# Patient Record
Sex: Male | Born: 1980 | Race: White | Hispanic: Yes | Marital: Married | Smoking: Former smoker
Health system: Southern US, Community
[De-identification: ages and names within clinical notes are randomized; demographics above are authoritative.]

## PROBLEM LIST (undated history)

## (undated) DIAGNOSIS — F419 Anxiety disorder, unspecified: Secondary | ICD-10-CM

## (undated) HISTORY — PX: APPENDECTOMY: SHX54

---

## 2016-05-27 ENCOUNTER — Ambulatory Visit: Payer: Self-pay | Admitting: Podiatry

## 2018-01-26 ENCOUNTER — Encounter (HOSPITAL_COMMUNITY): Payer: Self-pay | Admitting: Family Medicine

## 2018-01-26 ENCOUNTER — Ambulatory Visit (HOSPITAL_COMMUNITY)
Admission: EM | Admit: 2018-01-26 | Discharge: 2018-01-26 | Disposition: A | Discharge status: Home / Self Care | Payer: Self-pay | Attending: Internal Medicine | Admitting: Internal Medicine

## 2018-01-26 DIAGNOSIS — J029 Acute pharyngitis, unspecified: Secondary | ICD-10-CM | POA: Insufficient documentation

## 2018-01-26 LAB — POCT RAPID STREP A: Streptococcus, Group A Screen (Direct): NEGATIVE

## 2018-01-26 MED ORDER — CETIRIZINE HCL 10 MG PO CAPS: 10.0000 mg | ORAL_CAPSULE | Freq: Every day | ORAL | 0 refills | Status: DC

## 2018-01-26 NOTE — ED Provider Notes (Signed)
Steve Jimenez    CSN: [id number] Arrival date & time: 01/26/18  1944     History   Chief Complaint Chief Complaint  Patient presents with  . Sore Throat    HPI Steve Jimenez is a 37 y.o. male no significant past medical history presenting today for evaluation of sore throat.  States he has had a sore throat for approximately 1 week, but feels like symptoms have worsened today.  States he had strep earlier this year followed by a sinus infection which she has been on antibiotics for.  States he never fully felt better.  Denies any congestion or rhinorrhea at this time.  Denies cough.  Denies fevers, but states he is taking a lot of ibuprofen daily.  Denies any nausea or vomiting.  Tolerating oral intake like normal.  HPI  History reviewed. No pertinent past medical history.  There are no active problems to display for this patient.   History reviewed. No pertinent surgical history.     Home Medications    Prior to Admission medications   Not on File    Family History History reviewed. No pertinent family history.  Social History Social History   Tobacco Use  . Smoking status: Never Smoker  . Smokeless tobacco: Never Used  Substance Use Topics  . Alcohol use: Not on file  . Drug use: Not on file     Allergies   Patient has no known allergies.   Review of Systems Review of Systems  Constitutional: Negative for activity change, appetite change, fatigue and fever.  HENT: Positive for sore throat. Negative for congestion, ear pain, postnasal drip, rhinorrhea and sinus pressure.   Eyes: Negative for pain and itching.  Respiratory: Negative for cough and shortness of breath.   Cardiovascular: Negative for chest pain.  Gastrointestinal: Negative for abdominal pain, diarrhea, nausea and vomiting.  Musculoskeletal: Negative for myalgias.  Skin: Negative for rash.  Neurological: Negative for dizziness, light-headedness and headaches.      Physical Exam Triage Vital Signs ED Triage Vitals [01/26/18 2109]  Enc Vitals Group     BP      Pulse      Resp      Temp      Temp src      SpO2      Weight      Height      Head Circumference      Peak Flow      Pain Score 4     Pain Loc      Pain Edu?      Excl. in [place]?    No data found.  Updated Vital Signs There were no vitals taken for this visit.  Visual Acuity Right Eye Distance:   Left Eye Distance:   Bilateral Distance:    Right Eye Near:   Left Eye Near:    Bilateral Near:     Physical Exam  Constitutional: He appears well-developed and well-nourished.  HENT:  Head: Normocephalic and atraumatic.  Bilateral TMs nonerythematous, nasal mucosa erythematous, dried rhinorrhea present, posterior oropharynx erythematous, moderate tonsillar enlargement, no exudate.  No uvula swelling or deviation.  Eyes: Conjunctivae are normal.  Neck: Neck supple.  Positive tonsillar lymphadenopathy  Cardiovascular: Normal rate and regular rhythm.  No murmur heard. Pulmonary/Chest: Effort normal and breath sounds normal. No respiratory distress.  Breathing comfortably at rest, CTA BL  Abdominal: Soft. There is no tenderness.  Musculoskeletal: He exhibits no edema.  Neurological: He is alert.  Skin: Skin is warm and dry.  Psychiatric: He has a normal mood and affect.  Nursing note and vitals reviewed.    UC Treatments / Results  Labs (all labs ordered are listed, but only abnormal results are displayed) Labs Reviewed - No data to display  EKG None Radiology No results found.  Procedures Procedures (including critical care time)  Medications Ordered in UC Medications - No data to display   Initial Impression / Assessment and Plan / UC Course  I have reviewed the triage vital signs and the nursing notes.  Pertinent labs & imaging results that were available during my care of the patient were reviewed by me and considered in my medical decision making  (see chart for details).     Strep test negative, sore throat likely viral versus postnasal drainage.  Will begin on daily allergy pill to help with drainage.  Discussed over-the-counter measures to better control his sore throat. Discussed strict return precautions. Patient verbalized understanding and is agreeable with plan.   Final Clinical Impressions(s) / UC Diagnoses   Final diagnoses:  None    ED Discharge Orders    None       Controlled Substance Prescriptions [place] Controlled Substance Registry consulted? Not Applicable   Janith Lima, [place] 01/27/18 [phone]

## 2018-01-26 NOTE — Discharge Instructions (Signed)

## 2018-01-26 NOTE — ED Triage Notes (Signed)
Pt here for sore throat x 1 week. Hx of strep. Pain and swelling more on the right

## 2018-01-28 ENCOUNTER — Telehealth (HOSPITAL_COMMUNITY): Payer: Self-pay

## 2018-01-28 LAB — CULTURE, GROUP A STREP (THRC)

## 2018-01-28 MED ORDER — PENICILLIN V POTASSIUM 500 MG PO TABS: 500.0000 mg | ORAL_TABLET | Freq: Two times a day (BID) | ORAL | 0 refills | Status: AC

## 2018-01-28 NOTE — Telephone Encounter (Signed)
Culture is positive for group A Strep germ.  Prescription for penicillin V 500mg  bid x 10d #20 no refills sent to the pharmacy of record. PT contacted and made aware. Recheck for further evaluation if symptoms are not improving.  Verbalized understanding.

## 2018-02-15 ENCOUNTER — Other Ambulatory Visit: Payer: Self-pay

## 2018-02-15 ENCOUNTER — Encounter (HOSPITAL_COMMUNITY): Payer: Self-pay | Admitting: Emergency Medicine

## 2018-02-15 ENCOUNTER — Ambulatory Visit (HOSPITAL_COMMUNITY)
Admission: EM | Admit: 2018-02-15 | Discharge: 2018-02-15 | Disposition: A | Discharge status: Home / Self Care | Payer: Self-pay | Attending: Family Medicine | Admitting: Family Medicine

## 2018-02-15 DIAGNOSIS — J020 Streptococcal pharyngitis: Secondary | ICD-10-CM

## 2018-02-15 DIAGNOSIS — J029 Acute pharyngitis, unspecified: Secondary | ICD-10-CM

## 2018-02-15 LAB — POCT RAPID STREP A: Streptococcus, Group A Screen (Direct): POSITIVE — AB

## 2018-02-15 MED ORDER — PENICILLIN G BENZATHINE 1200000 UNIT/2ML IM SUSP
1.2000 10*6.[IU] | Freq: Once | INTRAMUSCULAR | Status: AC
  Administered 2018-02-15: 1.2 10*6.[IU] via INTRAMUSCULAR

## 2018-02-15 MED ORDER — PENICILLIN G BENZATHINE 1200000 UNIT/2ML IM SUSP
INTRAMUSCULAR | Status: AC
  Filled 2018-02-15: qty 2

## 2018-02-15 NOTE — ED Triage Notes (Signed)
The patient continued to have symptoms of strep throat after receiving treatment for strep 2 weeks prior. The patient advised that he completed his course of antibiotics and the symptoms returned after completion.

## 2018-02-15 NOTE — ED Provider Notes (Signed)
[place]   [id number] 02/15/18 Arrival Time: 8119  ASSESSMENT & PLAN:  1. Sore throat   2. Strep pharyngitis     Meds ordered this encounter  Medications  . penicillin g benzathine (BICILLIN LA) 1200000 UNIT/2ML injection 1.2 Million Units    Order Specific Question:   Antibiotic Indication:    Answer:   Pharyngitis    Results for orders placed or performed during the hospital encounter of 02/15/18  POCT rapid strep A [facility])  Result Value Ref Range   Streptococcus, Group A Screen (Direct) POSITIVE (A) NEGATIVE   Recommend ENT evaluation if he continues to have recurrent sore throats. Information re: ENT given. OTC analgesics and throat care as needed Will follow up if not showing significant improvement over the next 24-48 hours.  Reviewed expectations re: course of current medical issues. Questions answered. Outlined signs and symptoms indicating need for more acute intervention. Patient verbalized understanding. After Visit Summary given.   SUBJECTIVE:  Steve Jimenez is a 37 y.o. male who reports a sore throat. Has been treated twice in the past month for strep throat; one + rapid test and one + culture. Better after taking antibiotics (amoxicillin and PCN respectively). Symptoms have returned a few days after finishing. Current ST for 2-3 days. Worsening. Painful swallowing last evening. No specific aggravating or alleviating factors reported. No respiratory symptoms. Normal PO intake but reports discomfort with swallowing. Fever reported: no. No associated n/v/abdominal symptoms. Sick contacts: none.  OTC treatment: ibuprofen with some relief.  ROS: As per HPI.   OBJECTIVE:  Vitals:   02/15/18 1011  BP: 136/83  Pulse: 65  Resp: 18  Temp: 98.5 F (36.9 C)  TempSrc: Oral  SpO2: 100%     General appearance: alert; no distress HEENT: throat with tonsillar hypertrophy and mild erythema; uvula midline yes Neck: supple with FROM; no  appreciable LAD Lungs: clear to auscultation bilaterally Skin: reveals no rash; warm and dry Psychological: alert and cooperative; normal mood and affect  No Known Allergies  PMH: As in HPI.  Social History   Socioeconomic History  . Marital status: Married    Spouse name: Not on file  . Number of children: Not on file  . Years of education: Not on file  . Highest education level: Not on file  Occupational History  . Not on file  Social Needs  . Financial resource strain: Not on file  . Food insecurity:    Worry: Not on file    Inability: Not on file  . Transportation needs:    Medical: Not on file    Non-medical: Not on file  Tobacco Use  . Smoking status: Never Smoker  . Smokeless tobacco: Never Used  Substance and Sexual Activity  . Alcohol use: Not on file  . Drug use: Not on file  . Sexual activity: Not on file  Lifestyle  . Physical activity:    Days per week: Not on file    Minutes per session: Not on file  . Stress: Not on file  Relationships  . Social connections:    Talks on phone: Not on file    Gets together: Not on file    Attends religious service: Not on file    Active member of club or organization: Not on file    Attends meetings of clubs or organizations: Not on file    Relationship status: Not on file  . Intimate partner violence:    Fear of current  or ex partner: Not on file    Emotionally abused: Not on file    Physically abused: Not on file    Forced sexual activity: Not on file  Other Topics Concern  . Not on file  Social History Narrative  . Not on file   History reviewed. No pertinent family history.        Vanessa Kick, MD 02/15/18 1106

## 2018-10-27 ENCOUNTER — Encounter (HOSPITAL_COMMUNITY): Payer: Self-pay | Admitting: Emergency Medicine

## 2018-10-27 ENCOUNTER — Ambulatory Visit (HOSPITAL_COMMUNITY)
Admission: EM | Admit: 2018-10-27 | Discharge: 2018-10-27 | Disposition: A | Discharge status: Home / Self Care | Payer: 59 | Attending: Family Medicine | Admitting: Family Medicine

## 2018-10-27 DIAGNOSIS — R21 Rash and other nonspecific skin eruption: Secondary | ICD-10-CM | POA: Insufficient documentation

## 2018-10-27 MED ORDER — METHYLPREDNISOLONE SODIUM SUCC 125 MG IJ SOLR
80.0000 mg | Freq: Once | INTRAMUSCULAR | Status: AC
  Administered 2018-10-27: 80 mg via INTRAMUSCULAR

## 2018-10-27 NOTE — ED Provider Notes (Signed)
[place]    CSN: [id number] Arrival date & time: 10/27/18  1426     History   Chief Complaint Chief Complaint  Patient presents with  . Appointment    330  . Rash    HPI Steve Jimenez is a 38 y.o. male.   HPI  Patient was in [place] last week.  He was walking around shorts around outdoors and states he went kayaking.  He came back and developed a rash on both legs on his left arm.  He feels like it is poison ivy.  He states he always gets serious reactions to poison ivy, and the only way he can cleared up is with a cortisone injection.  He is requesting a cortisone shot today.  History reviewed. No pertinent past medical history.  There are no active problems to display for this patient.   Past Surgical History:  Procedure Laterality Date  . APPENDECTOMY         Home Medications    Prior to Admission medications   Medication Sig Start Date End Date Taking? Authorizing Provider  Cetirizine HCl 10 MG CAPS Take 1 capsule (10 mg total) by mouth daily for 15 days. Patient not taking: Reported on 10/27/2018 01/26/18 02/10/18  Wieters, Hallie C, PA-C  penicillin v potassium (VEETID) 250 MG tablet Take 250 mg by mouth 4 (four) times daily.    [provider]    Family History Family History  Family history unknown: Yes    Social History Social History   Tobacco Use  . Smoking status: Never Smoker  . Smokeless tobacco: Never Used  Substance Use Topics  . Alcohol use: Yes  . Drug use: Not on file     Allergies   Aspirin and Bee venom   Review of Systems Review of Systems  Constitutional: Negative for chills and fever.  HENT: Negative for ear pain and sore throat.   Eyes: Negative for pain and visual disturbance.  Respiratory: Negative for cough and shortness of breath.   Cardiovascular: Negative for chest pain and palpitations.  Gastrointestinal: Negative for abdominal pain and vomiting.  Genitourinary: Negative for dysuria  and hematuria.  Musculoskeletal: Negative for arthralgias and back pain.  Skin: Positive for rash. Negative for color change.  Neurological: Negative for seizures and syncope.  All other systems reviewed and are negative.    Physical Exam Triage Vital Signs ED Triage Vitals  Enc Vitals Group     BP 10/27/18 1524 (!) 149/83     Pulse Rate 10/27/18 1524 70     Resp 10/27/18 1524 18     Temp 10/27/18 1524 98.2 F (36.8 C)     Temp src --      SpO2 10/27/18 1524 99 %     Weight --      Height --      Head Circumference --      Peak Flow --      Pain Score 10/27/18 1525 0     Pain Loc --      Pain Edu? --      Excl. in [place]? --    No data found.  Updated Vital Signs BP (!) 149/83   Pulse 70   Temp 98.2 F (36.8 C)   Resp 18   SpO2 99%   Visual Acuity Right Eye Distance:   Left Eye Distance:   Bilateral Distance:    Right Eye Near:   Left Eye Near:    Bilateral Near:  Physical Exam Constitutional:      General: He is not in acute distress.    Appearance: He is well-developed.  HENT:     Head: Normocephalic and atraumatic.  Eyes:     Conjunctiva/sclera: Conjunctivae normal.     Pupils: Pupils are equal, round, and reactive to light.  Neck:     Musculoskeletal: Normal range of motion.  Cardiovascular:     Rate and Rhythm: Normal rate and regular rhythm.     Heart sounds: Normal heart sounds.  Pulmonary:     Effort: Pulmonary effort is normal. No respiratory distress.     Breath sounds: Normal breath sounds.  Abdominal:     General: There is no distension.     Palpations: Abdomen is soft.  Musculoskeletal: Normal range of motion.  Skin:    General: Skin is warm and dry.     Comments: Both lower extremities are number of small erythematous vesicles, mostly are separate but there is a couple that are linear.  I discussed with him that these could be insect bites versus contact dermatitis/poison ivy.  He states this is typical for his poison ivy.  I told him  without soft tissue swelling we could treat his rash with a cream.  He strongly feels like an injection is easier for him and more effective  Neurological:     Mental Status: He is alert.      UC Treatments / Results  Labs (all labs ordered are listed, but only abnormal results are displayed) Labs Reviewed - No data to display  EKG None  Radiology No results found.  Procedures Procedures (including critical care time)  Medications Ordered in UC Medications  methylPREDNISolone sodium succinate (SOLU-MEDROL) 125 mg/2 mL injection 80 mg (80 mg Intramuscular Given 10/27/18 1543)    Initial Impression / Assessment and Plan / UC Course  I have reviewed the triage vital signs and the nursing notes.  Pertinent labs & imaging results that were available during my care of the patient were reviewed by me and considered in my medical decision making (see chart for details).      Final Clinical Impressions(s) / UC Diagnoses   Final diagnoses:  Rash     Discharge Instructions     Double up on the Zyrtec and take 1 pill twice a day until rash resolves You have received a steroid shot to help fight the inflammation and allergic reaction Return as needed   ED Prescriptions    None     Controlled Substance Prescriptions [place] Controlled Substance Registry consulted? Not Applicable   Raylene Everts, MD 10/27/18 2215

## 2018-10-27 NOTE — Discharge Instructions (Addendum)
Double up on the Zyrtec and take 1 pill twice a day until rash resolves You have received a steroid shot to help fight the inflammation and allergic reaction Return as needed

## 2018-10-27 NOTE — ED Triage Notes (Signed)
Pt states he thinks he has poison ivy on bilateral legs and a little on his arm since Sunday.

## 2018-11-20 ENCOUNTER — Other Ambulatory Visit: Payer: Self-pay

## 2018-11-20 ENCOUNTER — Ambulatory Visit (HOSPITAL_COMMUNITY)
Admission: EM | Admit: 2018-11-20 | Discharge: 2018-11-20 | Disposition: A | Discharge status: Home / Self Care | Payer: 59 | Attending: Internal Medicine | Admitting: Internal Medicine

## 2018-11-20 ENCOUNTER — Encounter (HOSPITAL_COMMUNITY): Payer: Self-pay | Admitting: Emergency Medicine

## 2018-11-20 DIAGNOSIS — L0291 Cutaneous abscess, unspecified: Secondary | ICD-10-CM

## 2018-11-20 MED ORDER — TETRACAINE HCL 0.5 % OP SOLN
OPHTHALMIC | Status: AC
  Filled 2018-11-20: qty 4

## 2018-11-20 MED ORDER — DOXYCYCLINE HYCLATE 100 MG PO CAPS: 100.0000 mg | ORAL_CAPSULE | Freq: Two times a day (BID) | ORAL | 0 refills | Status: AC

## 2018-11-20 MED ORDER — FLUORESCEIN SODIUM 1 MG OP STRP
ORAL_STRIP | OPHTHALMIC | Status: AC
  Filled 2018-11-20: qty 1

## 2018-11-20 NOTE — Discharge Instructions (Signed)
Complete course of antibiotics.  Warm compresses to the area 3-4 times a day, ideally to promote drainage.   Tylenol and/or ibuprofen as needed for pain.  If worsening or no improvement please return as may need incision and drainage of these.

## 2018-11-20 NOTE — ED Triage Notes (Signed)
Patient reports having boils 2 years ago, treated with antibiotics.  Over the week end noticed 2 boils and decided to come to [facility].  One lower left abdomen, one on left side at waist

## 2018-11-21 NOTE — ED Provider Notes (Signed)
[place]    CSN: [id number] Arrival date & time: 11/20/18  1601     History   Chief Complaint Chief Complaint  Patient presents with  . Abscess  . Appointment    4:15 pm    HPI Moriah Loughry Francisco is a 38 y.o. male.   Lenorris presents with concerns of two boils to his trunk. Noted first two days ago and feels they are worsening. Mildly painful. No drainage. No known injury or exposure to the area. States had similar 2 years ago and antibiotics helped with resolution. Denies previous I&D, denies known MRSA history. No known fevers. No nausea or vomiting. Has been applying warm compresses which have minimally helped. Pain 5/10. Without contributing medical history.      ROS per HPI.      History reviewed. No pertinent past medical history.  There are no active problems to display for this patient.   Past Surgical History:  Procedure Laterality Date  . APPENDECTOMY         Home Medications    Prior to Admission medications   Medication Sig Start Date End Date Taking? Authorizing Provider  doxycycline (VIBRAMYCIN) 100 MG capsule Take 1 capsule (100 mg total) by mouth 2 (two) times daily for 7 days. 11/20/18 11/27/18  Zigmund Gottron, NP    Family History Family History  Problem Relation Age of Onset  . Healthy Mother   . Healthy Father     Social History Social History   Tobacco Use  . Smoking status: Never Smoker  . Smokeless tobacco: Never Used  Substance Use Topics  . Alcohol use: Yes  . Drug use: Not on file     Allergies   Aspirin and Bee venom   Review of Systems Review of Systems   Physical Exam Triage Vital Signs ED Triage Vitals  Enc Vitals Group     BP 11/20/18 1624 (!) 141/96     Pulse Rate 11/20/18 1624 62     Resp 11/20/18 1624 18     Temp 11/20/18 1624 98.1 F (36.7 C)     Temp Source 11/20/18 1624 Oral     SpO2 11/20/18 1624 100 %     Weight --      Height --      Head Circumference --      Peak Flow  --      Pain Score 11/20/18 1639 5     Pain Loc --      Pain Edu? --      Excl. in [place]? --    No data found.  Updated Vital Signs BP (!) 141/96 (BP Location: Left Arm)   Pulse 62   Temp 98.1 F (36.7 C) (Oral)   Resp 18   SpO2 100%    Physical Exam Constitutional:      Appearance: He is well-developed.  Cardiovascular:     Rate and Rhythm: Normal rate and regular rhythm.  Pulmonary:     Effort: Pulmonary effort is normal.     Breath sounds: Normal breath sounds.  Skin:    General: Skin is warm and dry.          Comments: Two nickel sized soft red and tender boils, to left low trunk and left low abdomen; minimal fluctuance, no induration; no drainage  Neurological:     Mental Status: He is alert and oriented to person, place, and time.      UC Treatments / Results  Labs (all labs  ordered are listed, but only abnormal results are displayed) Labs Reviewed - No data to display  EKG None  Radiology No results found.  Procedures Procedures (including critical care time)  Medications Ordered in UC Medications - No data to display  Initial Impression / Assessment and Plan / UC Course  I have reviewed the triage vital signs and the nursing notes.  Pertinent labs & imaging results that were available during my care of the patient were reviewed by me and considered in my medical decision making (see chart for details).     Two boil's present with minimal surrounding cellulitis; minimal fluctuance, do not feel these would benefit from incision at this time. Warm compresses, antibiotics. Return precautions provided as may need incision if no improvement with treatment. Patient verbalized understanding and agreeable to plan.    Final Clinical Impressions(s) / UC Diagnoses   Final diagnoses:  Abscess     Discharge Instructions     Complete course of antibiotics.  Warm compresses to the area 3-4 times a day, ideally to promote drainage.   Tylenol and/or  ibuprofen as needed for pain.  If worsening or no improvement please return as may need incision and drainage of these.     ED Prescriptions    Medication Sig Dispense Auth. Provider   doxycycline (VIBRAMYCIN) 100 MG capsule Take 1 capsule (100 mg total) by mouth 2 (two) times daily for 7 days. 14 capsule Zigmund Gottron, NP     Controlled Substance Prescriptions [place] Controlled Substance Registry consulted? Not Applicable   Zigmund Gottron, NP 11/21/18 1134

## 2018-12-21 ENCOUNTER — Encounter: Payer: Self-pay | Admitting: *Deleted

## 2018-12-21 ENCOUNTER — Telehealth: Payer: Self-pay | Admitting: *Deleted

## 2018-12-21 NOTE — Telephone Encounter (Signed)
Referral fax placed in scheduling box [name] PA [place] at [facility] [phone]

## 2019-01-30 ENCOUNTER — Ambulatory Visit: Payer: 59 | Admitting: Cardiovascular Disease

## 2019-02-27 ENCOUNTER — Ambulatory Visit: Payer: 59 | Admitting: Cardiovascular Disease

## 2019-07-10 ENCOUNTER — Ambulatory Visit: Payer: 59 | Admitting: Cardiovascular Disease

## 2019-07-27 ENCOUNTER — Ambulatory Visit: Payer: 59 | Admitting: Cardiovascular Disease

## 2019-07-27 ENCOUNTER — Other Ambulatory Visit: Payer: Self-pay

## 2019-07-27 ENCOUNTER — Encounter: Payer: Self-pay | Admitting: Cardiovascular Disease

## 2019-07-27 DIAGNOSIS — R0789 Other chest pain: Secondary | ICD-10-CM | POA: Diagnosis not present

## 2019-07-27 DIAGNOSIS — R079 Chest pain, unspecified: Secondary | ICD-10-CM | POA: Diagnosis not present

## 2019-07-27 DIAGNOSIS — E782 Mixed hyperlipidemia: Secondary | ICD-10-CM | POA: Diagnosis not present

## 2019-07-27 DIAGNOSIS — E785 Hyperlipidemia, unspecified: Secondary | ICD-10-CM | POA: Insufficient documentation

## 2019-07-27 MED ORDER — COENZYME Q10 200 MG PO CAPS: 200.0000 mg | ORAL_CAPSULE | Freq: Every day | ORAL | 3 refills | Status: DC

## 2019-07-27 MED ORDER — ATORVASTATIN CALCIUM 20 MG PO TABS: 20.0000 mg | ORAL_TABLET | Freq: Every day | ORAL | 3 refills | Status: DC

## 2019-07-27 NOTE — Assessment & Plan Note (Signed)
Mr. Kett  was referred to me by [name], PA-C for evaluation of atypical chest pain.  His only risk factor is mild hyperlipidemia.  He said he developed chest pain approxi-3 to 4 years ago.  Occurs once or twice a year and lasts for a week at a time 24/7 and then resolve spontaneously.  I do not think this is is ischemically mediated.  And to get a coronary calcium score to restratify

## 2019-07-27 NOTE — Patient Instructions (Addendum)
Medication Instructions:  Your physician has recommended you make the following change in your medication:   START ATORVASTATIN (LIPITOR) 20 MG BY MOUTH DAILY  START COENZYME Q 10, 200 MG BY MOUTH DAILY  If you need a refill on your cardiac medications before your next appointment, please call your pharmacy.   Lab work: Your physician recommends that you return for lab work in 2-3 MONTHS:  FASTING LIPID PROFILE AND LIVER FUNCTION TEST.   YOU WILL RECEIVE A LAB SLIP IN THE MAIL. PLEASE DO NOT EAT OR DRINK (EXCEPT WATER) ANYTHING AFTER MIDNIGHT ON THE DAY YOU CHOOSE TO PRESENT FOR LAB WORK. YOU MAY EAT AFTER YOUR BLOOD HAS BEEN COLLECTED. NO APPOINTMENT IS NEEDED.   If you have labs (blood work) drawn today and your tests are completely normal, you will receive your results only by: Marland Kitchen MyChart Message (if you have MyChart) OR . A paper copy in the mail If you have any lab test that is abnormal or we need to change your treatment, we will call you to review the results.  Testing/Procedures: Your physician has recommended that you have a coronary calcium scan. This will cost $150 out-of-pocket.  Coronary Calcium Scan A coronary calcium scan is an imaging test used to look for deposits of calcium and other fatty materials (plaques) in the inner lining of the blood vessels of the heart (coronary arteries). These deposits of calcium and plaques can partly clog and narrow the coronary arteries without producing any symptoms or warning signs. This puts a person at risk for a heart attack. This test can detect these deposits before symptoms develop. Tell a health care provider about:  Any allergies you have.  All medicines you are taking, including vitamins, herbs, eye drops, creams, and over-the-counter medicines.  Any problems you or family members have had with anesthetic medicines.  Any blood disorders you have.  Any surgeries you have had.  Any medical conditions you have.  Whether  you are pregnant or may be pregnant. What are the risks? Generally, this is a safe procedure. However, problems may occur, including:  Harm to a pregnant woman and her unborn baby. This test involves the use of radiation. Radiation exposure can be dangerous to a pregnant woman and her unborn baby. If you are pregnant, you generally should not have this procedure done.  Slight increase in the risk of cancer. This is because of the radiation involved in the test. What happens before the procedure? No preparation is needed for this procedure. What happens during the procedure?   You will undress and remove any jewelry around your neck or chest.  You will put on a hospital gown.  Sticky electrodes will be placed on your chest. The electrodes will be connected to an electrocardiogram (ECG) machine to record a tracing of the electrical activity of your heart.  A CT scanner will take pictures of your heart. During this time, you will be asked to lie still and hold your breath for 2-3 seconds while a picture of your heart is being taken. The procedure may vary among health care providers and hospitals. What happens after the procedure?  You can get dressed.  You can return to your normal activities.  It is up to you to get the results of your test. Ask your health care provider, or the department that is doing the test, when your results will be ready. Summary  A coronary calcium scan is an imaging test used to look for deposits  of calcium and other fatty materials (plaques) in the inner lining of the blood vessels of the heart (coronary arteries).  Generally, this is a safe procedure. Tell your health care provider if you are pregnant or may be pregnant.  No preparation is needed for this procedure.  A CT scanner will take pictures of your heart.  You can return to your normal activities after the scan is done. This information is not intended to replace advice given to you by your  health care provider. Make sure you discuss any questions you have with your health care provider.   Follow-Up: At [facility], you and your health needs are our priority.  As part of our continuing mission to provide you with exceptional heart care, we have created designated Provider Care Teams.  These Care Teams include your primary Cardiologist (physician) and Advanced Practice Providers (APPs -  Physician Assistants and Nurse Practitioners) who all work together to provide you with the care you need, when you need it. You will need a follow up appointment in 6 months with Dr. Quay Burow.  Please call our office 2 months in advance to schedule this appointment.    Any Other Special Instructions Will Be Listed Below (If Applicable).      Heart-Healthy Eating Plan Heart-healthy meal planning includes:  Eating less unhealthy fats.  Eating more healthy fats.  Making other changes in your diet. Talk with your doctor or a diet specialist (dietitian) to create an eating plan that is right for you. What is my plan? Your doctor may recommend an eating plan that includes:  Total fat: ______% or less of total calories a day.  Saturated fat: ______% or less of total calories a day.  Cholesterol: less than _________mg a day. What are tips for following this plan? Cooking Avoid frying your food. Try to bake, boil, grill, or broil it instead. You can also reduce fat by:  Removing the skin from poultry.  Removing all visible fats from meats.  Steaming vegetables in water or broth. Meal planning   At meals, divide your plate into four equal parts: ? Fill one-half of your plate with vegetables and green salads. ? Fill one-fourth of your plate with whole grains. ? Fill one-fourth of your plate with lean protein foods.  Eat 4-5 servings of vegetables per day. A serving of vegetables is: ? 1 cup of raw or cooked vegetables. ? 2 cups of raw leafy greens.  Eat 4-5 servings of  fruit per day. A serving of fruit is: ? 1 medium whole fruit. ?  cup of dried fruit. ?  cup of fresh, frozen, or canned fruit. ?  cup of 100% fruit juice.  Eat more foods that have soluble fiber. These are apples, broccoli, carrots, beans, peas, and barley. Try to get 20-30 g of fiber per day.  Eat 4-5 servings of nuts, legumes, and seeds per week: ? 1 serving of dried beans or legumes equals  cup after being cooked. ? 1 serving of nuts is  cup. ? 1 serving of seeds equals 1 tablespoon. General information  Eat more home-cooked food. Eat less restaurant, buffet, and fast food.  Limit or avoid alcohol.  Limit foods that are high in starch and sugar.  Avoid fried foods.  Lose weight if you are overweight.  Keep track of how much salt (sodium) you eat. This is important if you have high blood pressure. Ask your doctor to tell you more about this.  Try to  add vegetarian meals each week. Fats  Choose healthy fats. These include olive oil and canola oil, flaxseeds, walnuts, almonds, and seeds.  Eat more omega-3 fats. These include salmon, mackerel, sardines, tuna, flaxseed oil, and ground flaxseeds. Try to eat fish at least 2 times each week.  Check food labels. Avoid foods with trans fats or high amounts of saturated fat.  Limit saturated fats. ? These are often found in animal products, such as meats, butter, and cream. ? These are also found in plant foods, such as palm oil, palm kernel oil, and coconut oil.  Avoid foods with partially hydrogenated oils in them. These have trans fats. Examples are stick margarine, some tub margarines, cookies, crackers, and other baked goods. What foods can I eat? Fruits All fresh, canned (in natural juice), or frozen fruits. Vegetables Fresh or frozen vegetables (raw, steamed, roasted, or grilled). Green salads. Grains Most grains. Choose whole wheat and whole grains most of the time. Rice and pasta, including brown rice and pastas  made with whole wheat. Meats and other proteins Lean, well-trimmed beef, veal, pork, and lamb. Chicken and [place] without skin. All fish and shellfish. Wild duck, rabbit, pheasant, and venison. Egg whites or low-cholesterol egg substitutes. Dried beans, peas, lentils, and tofu. Seeds and most nuts. Dairy Low-fat or nonfat cheeses, including ricotta and mozzarella. Skim or 1% milk that is liquid, powdered, or evaporated. Buttermilk that is made with low-fat milk. Nonfat or low-fat yogurt. Fats and oils Non-hydrogenated (trans-free) margarines. Vegetable oils, including soybean, sesame, sunflower, olive, peanut, safflower, corn, canola, and cottonseed. Salad dressings or mayonnaise made with a vegetable oil. Beverages Mineral water. Coffee and tea. Diet carbonated beverages. Sweets and desserts Sherbet, gelatin, and fruit ice. Small amounts of dark chocolate. Limit all sweets and desserts. Seasonings and condiments All seasonings and condiments. The items listed above may not be a complete list of foods and drinks you can eat. Contact a dietitian for more options. What foods should I avoid? Fruits Canned fruit in heavy syrup. Fruit in cream or butter sauce. Fried fruit. Limit coconut. Vegetables Vegetables cooked in cheese, cream, or butter sauce. Fried vegetables. Grains Breads that are made with saturated or trans fats, oils, or whole milk. Croissants. Sweet rolls. Donuts. High-fat crackers, such as cheese crackers. Meats and other proteins Fatty meats, such as hot dogs, ribs, sausage, bacon, rib-eye roast or steak. High-fat deli meats, such as salami and bologna. Caviar. Domestic duck and goose. Organ meats, such as liver. Dairy Cream, sour cream, cream cheese, and creamed cottage cheese. Whole-milk cheeses. Whole or 2% milk that is liquid, evaporated, or condensed. Whole buttermilk. Cream sauce or high-fat cheese sauce. Yogurt that is made from whole milk. Fats and oils Meat fat, or  shortening. Cocoa butter, hydrogenated oils, palm oil, coconut oil, palm kernel oil. Solid fats and shortenings, including bacon fat, salt pork, lard, and butter. Nondairy cream substitutes. Salad dressings with cheese or sour cream. Beverages Regular sodas and juice drinks with added sugar. Sweets and desserts Frosting. Pudding. Cookies. Cakes. Pies. Milk chocolate or white chocolate. Buttered syrups. Full-fat ice cream or ice cream drinks. The items listed above may not be a complete list of foods and drinks to avoid. Contact a dietitian for more information. Summary  Heart-healthy meal planning includes eating less unhealthy fats, eating more healthy fats, and making other changes in your diet.  Eat a balanced diet. This includes fruits and vegetables, low-fat or nonfat dairy, lean protein, nuts and legumes, whole grains, and  heart-healthy oils and fats. This information is not intended to replace advice given to you by your health care provider. Make sure you discuss any questions you have with your health care provider. Document Released: 03/21/2012 Document Revised: 11/24/2017 Document Reviewed: 10/28/2017 Elsevier Patient Education  2020 [employer].

## 2019-07-27 NOTE — Assessment & Plan Note (Signed)
Recent lipid profile performed by his PCP 12/21/2018 revealed a total cholesterol 208, LDL 143 and HDL 33.  Go to begin him on atorvastatin 20 mg a day along with co-Q10 200 mg a day and we will recheck a lipid liver profile in 2 to 3 months.

## 2019-07-27 NOTE — Progress Notes (Signed)
07/27/2019 Steve Jimenez   [birth year]  [id number]  Primary Physician Patient, No Pcp Per Primary Cardiologist: Lorretta Harp MD Steve Jimenez, [place], [place]  HPI:  Steve Jimenez is a 38 y.o. 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  He was referred by [name], PA-C for cardiovascular valuation because of atypical chest pain.  His only risk factor is remote tobacco abuse having smoked a half a pack per day for 10 years and quit 13 years ago.  He has mild hyperlipidemia with lipid profile performed by his PCP 12/21/2018 revealing total cholesterol 208, LDL 143 and HDL of 33 currently not on statin therapy.  There is no family history of heart disease.  Never had heart attack stroke.  He began noticing episodes of chest pain 4 years ago that occurred twice a year lasting a week at a time all day.  Not related to activity nor is it positional and resolve spontaneously.   No outpatient medications have been marked as taking for the 07/27/19 encounter (Office Visit) with Lorretta Harp, MD.     Allergies  Allergen Reactions  . Aspirin   . Bee Venom     Social History   Socioeconomic History  . Marital status: Married    Spouse name: Not on file  . Number of children: Not on file  . Years of education: Not on file  . Highest education level: Not on file  Occupational History  . Not on file  Social Needs  . Financial resource strain: Not on file  . Food insecurity    Worry: Not on file    Inability: Not on file  . Transportation needs    Medical: Not on file    Non-medical: Not on file  Tobacco Use  . Smoking status: Never Smoker  . Smokeless tobacco: Never Used  Substance and Sexual Activity  . Alcohol use: Yes  . Drug use: Not on file  . Sexual activity: Not on file  Lifestyle  . Physical activity    Days per week: Not on file    Minutes per session: Not on file  . Stress:  Not on file  Relationships  . Social Herbalist on phone: Not on file    Gets together: Not on file    Attends religious service: Not on file    Active member of club or organization: Not on file    Attends meetings of clubs or organizations: Not on file    Relationship status: Not on file  . Intimate partner violence    Fear of current or ex partner: Not on file    Emotionally abused: Not on file    Physically abused: Not on file    Forced sexual activity: Not on file  Other Topics Concern  . Not on file  Social History Narrative  . Not on file     Review of Systems: General: negative for chills, fever, night sweats or weight changes.  Cardiovascular: negative for chest pain, dyspnea on exertion, edema, orthopnea, palpitations, paroxysmal nocturnal dyspnea or shortness of breath Dermatological: negative for rash Respiratory: negative for cough or wheezing Urologic: negative for hematuria Abdominal: negative for nausea, vomiting, diarrhea, bright red blood per rectum, melena, or hematemesis Neurologic: negative for visual changes, syncope, or dizziness All other systems reviewed and are otherwise negative except as noted above.    Blood pressure 132/86,  pulse (!) 58, temperature 98 F (36.7 C), height 5\' 7"  (1.702 m), weight 218 lb 9.6 oz (99.2 kg), SpO2 98 %.  General appearance: alert and no distress Neck: no adenopathy, no carotid bruit, no JVD, supple, symmetrical, trachea midline and thyroid not enlarged, symmetric, no tenderness/mass/nodules Lungs: clear to auscultation bilaterally Heart: regular rate and rhythm, S1, S2 normal, no murmur, click, rub or gallop Extremities: extremities normal, atraumatic, no cyanosis or edema Pulses: 2+ and symmetric Skin: Skin color, texture, turgor normal. No rashes or lesions Neurologic: Alert and oriented X 3, normal strength and tone. Normal symmetric reflexes. Normal coordination and gait  EKG sinus rhythm at 63 without  ST or T wave changes.  I personally reviewed this EKG.  ASSESSMENT AND PLAN:   Atypical chest pain [name]  was referred to me by [name], PA-C for evaluation of atypical chest pain.  His only risk factor is mild hyperlipidemia.  He said he developed chest pain approxi-3 to 4 years ago.  Occurs once or twice a year and lasts for a week at a time 24/7 and then resolve spontaneously.  I do not think this is is ischemically mediated.  And to get a coronary calcium score to restratify  Hyperlipidemia Recent lipid profile performed by his PCP 12/21/2018 revealed a total cholesterol 208, LDL 143 and HDL 33.  Go to begin him on atorvastatin 20 mg a day along with co-Q10 200 mg a day and we will recheck a lipid liver profile in 2 to 3 months.      Lorretta Harp MD FACP,FACC,FAHA, [facility] 07/27/2019 3:26 PM

## 2019-08-02 ENCOUNTER — Ambulatory Visit (INDEPENDENT_AMBULATORY_CARE_PROVIDER_SITE_OTHER)
Admission: RE | Admit: 2019-08-02 | Discharge: 2019-08-02 | Disposition: A | Discharge status: Home / Self Care | Payer: Self-pay | Source: Ambulatory Visit | Attending: Cardiovascular Disease | Admitting: Cardiovascular Disease

## 2019-08-02 ENCOUNTER — Other Ambulatory Visit: Payer: Self-pay

## 2019-08-02 DIAGNOSIS — R079 Chest pain, unspecified: Secondary | ICD-10-CM

## 2019-08-02 DIAGNOSIS — R0789 Other chest pain: Secondary | ICD-10-CM

## 2019-09-27 ENCOUNTER — Encounter (HOSPITAL_COMMUNITY): Payer: Self-pay

## 2019-09-27 ENCOUNTER — Other Ambulatory Visit: Payer: Self-pay

## 2019-09-27 ENCOUNTER — Ambulatory Visit (HOSPITAL_COMMUNITY)
Admission: EM | Admit: 2019-09-27 | Discharge: 2019-09-27 | Disposition: A | Discharge status: Home / Self Care | Payer: 59 | Attending: Family Medicine | Admitting: Family Medicine

## 2019-09-27 DIAGNOSIS — R21 Rash and other nonspecific skin eruption: Secondary | ICD-10-CM | POA: Diagnosis not present

## 2019-09-27 DIAGNOSIS — W57XXXA Bitten or stung by nonvenomous insect and other nonvenomous arthropods, initial encounter: Secondary | ICD-10-CM | POA: Diagnosis not present

## 2019-09-27 MED ORDER — PREDNISONE 20 MG PO TABS: ORAL_TABLET | ORAL | 0 refills | Status: DC

## 2019-09-27 NOTE — ED Provider Notes (Signed)
[place]    CSN: [id number] Arrival date & time: 09/27/19  0848      History   Chief Complaint Chief Complaint  Patient presents with  . Rash    HPI Steve Jimenez is a 38 y.o. male.   [facility] patient  Patient presents to Urgent Care with complaints of itchy rash all over from what he believes to be poison oak or ivy since 4 days ago while he was at a lumber yard. Patient reports he rash is all over, but not on his face, has not tried home remedies.      History reviewed. No pertinent past medical history.  Patient Active Problem List   Diagnosis Date Noted  . Atypical chest pain 07/27/2019  . Hyperlipidemia 07/27/2019    Past Surgical History:  Procedure Laterality Date  . APPENDECTOMY         Home Medications    Prior to Admission medications   Medication Sig Start Date End Date Taking? Authorizing Provider  atorvastatin (LIPITOR) 20 MG tablet Take 1 tablet (20 mg total) by mouth daily. 07/27/19 10/25/19  Lorretta Harp, MD  Coenzyme Q10 200 MG capsule Take 1 capsule (200 mg total) by mouth daily. 07/27/19   Lorretta Harp, MD  predniSONE (DELTASONE) 20 MG tablet Two daily with food 09/27/19   Robyn Haber, MD    Family History Family History  Problem Relation Age of Onset  . Hypertension Mother   . Healthy Father     Social History Social History   Tobacco Use  . Smoking status: Never Smoker  . Smokeless tobacco: Never Used  Substance Use Topics  . Alcohol use: Yes    Comment: socially  . Drug use: Not on file     Allergies   Aspirin and Bee venom   Review of Systems Review of Systems   Physical Exam Triage Vital Signs ED Triage Vitals  Enc Vitals Group     BP 09/27/19 0902 137/86     Pulse Rate 09/27/19 0902 67     Resp 09/27/19 0902 16     Temp 09/27/19 0902 98 F (36.7 C)     Temp Source 09/27/19 0902 Oral     SpO2 09/27/19 0902 99 %     Weight --      Height --      Head  Circumference --      Peak Flow --      Pain Score 09/27/19 0901 0     Pain Loc --      Pain Edu? --      Excl. in [place]? --    No data found.  Updated Vital Signs BP 137/86 (BP Location: Left Arm)   Pulse 67   Temp 98 F (36.7 C) (Oral)   Resp 16   SpO2 99%    Physical Exam Vitals and nursing note reviewed.  Constitutional:      Appearance: Normal appearance. He is obese.  Eyes:     Conjunctiva/sclera: Conjunctivae normal.  Pulmonary:     Effort: Pulmonary effort is normal.  Musculoskeletal:        General: Normal range of motion.     Cervical back: Normal range of motion and neck supple.  Skin:    General: Skin is warm and dry.     Findings: Rash present.     Comments: Multiple discrete erythematous papules with central red dot over entire body including dorsal feet, up and down the  arms, and torso  Neurological:     General: No focal deficit present.     Mental Status: He is alert.  Psychiatric:        Mood and Affect: Mood normal.      UC Treatments / Results  Labs (all labs ordered are listed, but only abnormal results are displayed) Labs Reviewed - No data to display  EKG   Radiology No results found.  Procedures Procedures (including critical care time)  Medications Ordered in UC Medications - No data to display  Initial Impression / Assessment and Plan / UC Course  I have reviewed the triage vital signs and the nursing notes.  Pertinent labs & imaging results that were available during my care of the patient were reviewed by me and considered in my medical decision making (see chart for details).    Final Clinical Impressions(s) / UC Diagnoses   Final diagnoses:  Bedbug bite, initial encounter     Discharge Instructions     These discrete bumps are typical for a bed bug infestation, most likely at the hotel in which you stayed in [place]    ED Prescriptions    Medication [place]. Provider   predniSONE (DELTASONE) 20 MG  tablet Two daily with food 10 tablet Robyn Haber, MD     I have reviewed the PDMP during this encounter.   Robyn Haber, MD 09/27/19 [phone]

## 2019-09-27 NOTE — Discharge Instructions (Signed)
These discrete bumps are typical for a bed bug infestation, most likely at the hotel in which you stayed in [place]

## 2019-09-27 NOTE — ED Triage Notes (Signed)
Patient presents to Urgent Care with complaints of itchy rash all over from what he believes to be poison oak or ivy since 4 days ago while he was at a lumber yard. Patient reports he rash is all over, but not on his face, has not tried home remedies.

## 2019-10-05 DIAGNOSIS — I209 Angina pectoris, unspecified: Secondary | ICD-10-CM

## 2019-10-05 HISTORY — DX: Angina pectoris, unspecified: I20.9

## 2020-02-01 ENCOUNTER — Telehealth (INDEPENDENT_AMBULATORY_CARE_PROVIDER_SITE_OTHER): Payer: Self-pay | Admitting: Cardiology

## 2020-02-01 ENCOUNTER — Encounter: Payer: Self-pay | Admitting: Cardiology

## 2020-02-01 VITALS — Ht 67.0 in | Wt 205.0 lb

## 2020-02-01 DIAGNOSIS — E785 Hyperlipidemia, unspecified: Secondary | ICD-10-CM

## 2020-02-01 DIAGNOSIS — R0789 Other chest pain: Secondary | ICD-10-CM

## 2020-02-01 DIAGNOSIS — E782 Mixed hyperlipidemia: Secondary | ICD-10-CM

## 2020-02-01 NOTE — Progress Notes (Signed)
Virtual Visit via Telephone Note   This visit type was conducted due to national recommendations for restrictions regarding the COVID-19 Pandemic (e.g. social distancing) in an effort to limit this patient's exposure and mitigate transmission in our community.  Due to his co-morbid illnesses, this patient is at least at moderate risk for complications without adequate follow up.  This format is felt to be most appropriate for this patient at this time.  The patient did not have access to video technology/had technical difficulties with video requiring transitioning to audio format only (telephone).  All issues noted in this document were discussed and addressed.  No physical exam could be performed with this format.  Please refer to the patient's chart for his  consent to telehealth for [facility].   The patient was identified using 2 identifiers.  Date:  02/01/2020   ID:  Steve Jimenez, DOB June 03, 1981, MRN [id number]  Patient Location: Home Provider Location: Home  PCP:  Patient, No Pcp Per  Cardiologist:  Dr Gwenlyn Found Electrophysiologist:  None   Evaluation Performed:  Follow-Up Visit  Chief Complaint:  none  History of Present Illness:    Steve Jimenez is a 39 y.o. male with a history of chest pain that was felt to be atypical.  He was evaluated by Dr. Alvester Chou in October 2020.  His EKG was normal.  CT calcium scoring showed a score of 0.  He does have dyslipidemia and was placed on Lipitor 20 mg a day.  His PCP will follow this up, he has an appointment in June.  He told me today that he is only taking it every other day because of some side effects he noted.  He is not had recurrent chest pain.  The patient does not have symptoms concerning for COVID-19 infection (fever, chills, cough, or new shortness of breath).    History reviewed. No pertinent past medical history. Past Surgical History:  Procedure Laterality Date  . APPENDECTOMY       Current Meds  Medication  Sig  . atorvastatin (LIPITOR) 20 MG tablet Take 20 mg by mouth daily.  . Coenzyme Q10 200 MG capsule Take 1 capsule (200 mg total) by mouth daily.     Allergies:   Aspirin and Bee venom   Social History   Tobacco Use  . Smoking status: Never Smoker  . Smokeless tobacco: Never Used  Substance Use Topics  . Alcohol use: Yes    Comment: socially  . Drug use: Not on file     Family Hx: The patient's family history includes Healthy in his father; Hypertension in his mother.  ROS:   Please see the history of present illness.    All other systems reviewed and are negative.   Prior CV studies:   The following studies were reviewed today:   Labs/Other Tests and Data Reviewed:    EKG:  An ECG dated 07/27/2019 was personally reviewed today and demonstrated:  [id number]  Recent Labs: No results found for requested labs within last 8760 hours.   Recent Lipid Panel No results found for: CHOL, TRIG, HDL, CHOLHDL, LDLCALC, LDLDIRECT  Wt Readings from Last 3 Encounters:  02/01/20 205 lb (93 kg)  07/27/19 218 lb 9.6 oz (99.2 kg)     Objective:    Vital Signs:  Ht 5\' 7"  (1.702 m)   Wt 205 lb (93 kg)   BMI 32.11 kg/m    VITAL SIGNS:  reviewed  ASSESSMENT & PLAN:    C/P-  Felt to be atypical for angina. Ca++ score 0   HLD- PCP follows- he is taking Lipitor 20 mg QOD  Plan: PRN cardiology f/u  COVID-19 Education: The signs and symptoms of COVID-19 were discussed with the patient and how to seek care for testing (follow up with PCP or arrange E-visit).  The importance of social distancing was discussed today.  Time:   Today, I have spent 10 minutes with the patient with telehealth technology discussing the above problems.     Medication Adjustments/Labs and Tests Ordered: Current medicines are reviewed at length with the patient today.  Concerns regarding medicines are outlined above.   Tests Ordered: No orders of the defined types were placed in this  encounter.   Medication Changes: No orders of the defined types were placed in this encounter.   Follow Up:  Either In Person or Virtual prn  Signed, Kerin Ransom, PA-C  02/01/2020 11:15 AM    [place]

## 2020-02-01 NOTE — Patient Instructions (Signed)
Medication Instructions:  Your physician recommends that you continue on your current medications as directed. Please refer to the Current Medication list given to you today.  *If you need a refill on your cardiac medications before your next appointment, please call your pharmacy*   Follow-Up: At [facility], you and your health needs are our priority.  As part of our continuing mission to provide you with exceptional heart care, we have created designated Provider Care Teams.  These Care Teams include your primary Cardiologist (physician) and Advanced Practice Providers (APPs -  Physician Assistants and Nurse Practitioners) who all work together to provide you with the care you need, when you need it.  We recommend signing up for the patient portal called "MyChart".  Sign up information is provided on this After Visit Summary.  MyChart is used to connect with patients for Virtual Visits (Telemedicine).  Patients are able to view lab/test results, encounter notes, upcoming appointments, etc.  Non-urgent messages can be sent to your provider as well.   To learn more about what you can do with MyChart, go to [email or web address].    Your next appointment:   AS NEEDED with Dr. Berry 

## 2020-03-23 ENCOUNTER — Encounter (HOSPITAL_COMMUNITY): Payer: Self-pay

## 2020-03-23 ENCOUNTER — Other Ambulatory Visit: Payer: Self-pay

## 2020-03-23 ENCOUNTER — Ambulatory Visit (HOSPITAL_COMMUNITY)
Admission: RE | Admit: 2020-03-23 | Discharge: 2020-03-23 | Disposition: A | Discharge status: Home / Self Care | Payer: 59 | Source: Ambulatory Visit | Attending: Family Medicine | Admitting: Family Medicine

## 2020-03-23 VITALS — BP 129/83 | HR 60 | Temp 99.3°F | Resp 16 | Ht 67.0 in | Wt 210.0 lb

## 2020-03-23 DIAGNOSIS — L249 Irritant contact dermatitis, unspecified cause: Secondary | ICD-10-CM

## 2020-03-23 HISTORY — DX: Anxiety disorder, unspecified: F41.9

## 2020-03-23 MED ORDER — TRIAMCINOLONE ACETONIDE 0.1 % EX CREA: 1.0000 "application " | TOPICAL_CREAM | Freq: Two times a day (BID) | CUTANEOUS | 0 refills | Status: DC

## 2020-03-23 MED ORDER — DEXAMETHASONE SODIUM PHOSPHATE 10 MG/ML IJ SOLN
INTRAMUSCULAR | Status: AC
  Filled 2020-03-23: qty 1

## 2020-03-23 MED ORDER — DEXAMETHASONE SODIUM PHOSPHATE 10 MG/ML IJ SOLN
10.0000 mg | Freq: Once | INTRAMUSCULAR | Status: AC
  Administered 2020-03-23: 10 mg via INTRAMUSCULAR

## 2020-03-23 MED ORDER — DEXAMETHASONE SODIUM PHOSPHATE 10 MG/ML IJ SOLN: 10.0000 mg | Freq: Once | INTRAMUSCULAR | Status: DC

## 2020-03-23 NOTE — ED Provider Notes (Signed)
[facility]    CSN: [id number] Arrival date & time: 03/23/20  1341      History   Chief Complaint Chief Complaint  Patient presents with  . Rash    HPI Steve Jimenez is a 39 y.o. male.   HPI  Patient presents with a 2-day history of a rash on the right side of his neck.  Rash has not improved over the course the last 2 days and is now extended to the left side of his neck.  The rash is red and pruritic.  Patient endorses a history of allergy related to poison ivy.   Past Medical History:  Diagnosis Date  . Anxiety     Patient Active Problem List   Diagnosis Date Noted  . Atypical chest pain 07/27/2019  . Hyperlipidemia 07/27/2019    Past Surgical History:  Procedure Laterality Date  . APPENDECTOMY         Home Medications    Prior to Admission medications   Medication Sig Start Date End Date Taking? Authorizing Provider  ALPRAZolam Duanne Moron) 0.5 MG tablet Take 0.5 mg by mouth daily as needed. 12/21/19   [provider]  atorvastatin (LIPITOR) 20 MG tablet Take 20 mg by mouth daily.    [provider]  Coenzyme Q10 200 MG capsule Take 1 capsule (200 mg total) by mouth daily. 07/27/19   Lorretta Harp, MD  ibuprofen (ADVIL) 800 MG tablet Take 800 mg by mouth every 6 (six) hours as needed. 08/18/19   [provider]    Family History Family History  Problem Relation Age of Onset  . Hypertension Mother   . Healthy Father     Social History Social History   Tobacco Use  . Smoking status: Never Smoker  . Smokeless tobacco: Never Used  Vaping Use  . Vaping Use: Never used  Substance Use Topics  . Alcohol use: Yes    Comment: socially  . Drug use: Never     Allergies   Aspirin and Bee venom   Review of Systems Review of Systems Pertinent negatives listed in HPI  Physical Exam Triage Vital Signs ED Triage Vitals [03/23/20 1358]  Enc Vitals Group     BP 129/83     Pulse Rate 60     Resp 16      Temp 99.3 F (37.4 C)     Temp Source Oral     SpO2 98 %     Weight 210 lb (95.3 kg)     Height 5\' 7"  (1.702 m)     Head Circumference      Peak Flow      Pain Score 1     Pain Loc      Pain Edu?      Excl. in [place]?    No data found.  Updated Vital Signs BP 129/83   Pulse 60   Temp 99.3 F (37.4 C) (Oral)   Resp 16   Ht 5\' 7"  (1.702 m)   Wt 210 lb (95.3 kg)   SpO2 98%   BMI 32.89 kg/m   Visual Acuity Right Eye Distance:   Left Eye Distance:   Bilateral Distance:    Right Eye Near:   Left Eye Near:    Bilateral Near:     Physical Exam General appearance: alert, well developed, well nourished Head: Normocephalic, without obvious abnormality, atraumatic Respiratory: Respirations even and unlabored, normal respiratory rate Heart: rate and rhythm normal. No gallop or murmurs  noted on exam  Extremities: No gross deformities Skin: Upper body and BUE macular papular rash present, non edematous Psych: Appropriate mood and affect. Neurologic: GCS 15, gait intact , symmetric movements UC Treatments / Results  Labs (all labs ordered are listed, but only abnormal results are displayed) Labs Reviewed - No data to display  EKG   Radiology No results found.  Procedures Procedures (including critical care time)  Medications Ordered in UC Medications  dexamethasone (DECADRON) injection 10 mg (10 mg Intramuscular Given 03/23/20 1438)    Initial Impression / Assessment and Plan / UC Course  I have reviewed the triage vital signs and the nursing notes.  Pertinent labs & imaging results that were available during my care of the patient were reviewed by me and considered in my medical decision making (see chart for details).     Contact dermatitis, likely source if an irritant thought to be poison ivy. Decadron 10 mg IM administered in clinic. He will continue either benadryl or Pepcid AC to decrease current allergic reaction. Triamcinolone cream TID applied to the  affected area as needed An After Visit Summary was printed and given to the patient/family. Precautions discussed. Red flags discussed. Questions invited and answered. They voiced understanding and agreement.    Final Clinical Impressions(s) / UC Diagnoses   Final diagnoses:  Irritant contact dermatitis, unspecified trigger   Discharge Instructions   None    ED Prescriptions    Medication Sig Dispense Auth. Provider   triamcinolone cream (KENALOG) 0.1 % Apply 1 application topically 2 (two) times daily. 100 g Scot Jun, FNP     PDMP not reviewed this encounter.   Scot Jun, [place] 03/25/20 2154

## 2020-03-23 NOTE — ED Triage Notes (Signed)
Pt c/o rash on neckx3 days. Pt states he was at a facility that produces amines about 4 days. Pt states it thinks he may have gotten it on his clothes and they say if you get that on your skin it can cause a rash. Pt right side of neck is erythematous and has raised red clusters of tiny bumps.

## 2020-03-23 NOTE — Discharge Instructions (Signed)
You received a Decadron injection which is a steroid.  If your symptoms worsen and does not improved with this treatment return for evaluation.  I have also prescribed you triamcinolone cream which is a topical steroid cream which is great for itching and irritation.  Avoid close proximity to the eyes as this could be irritating to the eyes.

## 2020-03-31 ENCOUNTER — Ambulatory Visit (HOSPITAL_COMMUNITY)
Admission: EM | Admit: 2020-03-31 | Discharge: 2020-03-31 | Disposition: A | Discharge status: Home / Self Care | Payer: 59 | Attending: Internal Medicine | Admitting: Internal Medicine

## 2020-03-31 ENCOUNTER — Ambulatory Visit (HOSPITAL_COMMUNITY): Payer: 59

## 2020-03-31 ENCOUNTER — Encounter (HOSPITAL_COMMUNITY): Payer: Self-pay

## 2020-03-31 ENCOUNTER — Other Ambulatory Visit: Payer: Self-pay

## 2020-03-31 DIAGNOSIS — R21 Rash and other nonspecific skin eruption: Secondary | ICD-10-CM

## 2020-03-31 MED ORDER — HYDROXYZINE HCL 25 MG PO TABS: 25.0000 mg | ORAL_TABLET | Freq: Three times a day (TID) | ORAL | 0 refills | Status: DC | PRN

## 2020-03-31 MED ORDER — LIDOCAINE HCL (PF) 1 % IJ SOLN
INTRAMUSCULAR | Status: AC
Start: 2020-03-31 — End: ?
  Filled 2020-03-31: qty 30

## 2020-03-31 NOTE — ED Triage Notes (Signed)
Pt was here last week with complaints of a rash. States he was given steroid shot and a topical steroid. Pt reports 3 days ago he started having burning in the area again. The front of the patients neck is red. He is continuing to use the topical steroid.

## 2020-03-31 NOTE — ED Provider Notes (Signed)
[facility]    CSN: [id number] Arrival date & time: 03/31/20  0848      History   Chief Complaint Chief Complaint  Patient presents with  . Appointment    9  . Rash    HPI Steve Jimenez is a 39 y.o. male comes to the urgent care with complaints of itchy rash over the neck area.  Patient was seen last Thursday for rash over the neck and was prescribed steroid cream after being given dexamethasone injection in the urgent care.  Symptoms appear to have gotten better for couple of days and then the burning sensation with itching started again yesterday.  No rash visible.  No vesicles.  Patient believes that he has been exposed to poison ivy.  No history of herpes infection.   HPI  Past Medical History:  Diagnosis Date  . Anxiety     Patient Active Problem List   Diagnosis Date Noted  . Atypical chest pain 07/27/2019  . Hyperlipidemia 07/27/2019    Past Surgical History:  Procedure Laterality Date  . APPENDECTOMY         Home Medications    Prior to Admission medications   Medication Sig Start Date End Date Taking? Authorizing Provider  ALPRAZolam Duanne Moron) 0.5 MG tablet Take 0.5 mg by mouth daily as needed. 12/21/19   [provider]  atorvastatin (LIPITOR) 20 MG tablet Take 20 mg by mouth daily.    [provider]  Coenzyme Q10 200 MG capsule Take 1 capsule (200 mg total) by mouth daily. 07/27/19   Lorretta Harp, MD  hydrOXYzine (ATARAX/VISTARIL) 25 MG tablet Take 1 tablet (25 mg total) by mouth every 8 (eight) hours as needed for itching. 03/31/20   Lamptey, Myrene Galas, MD  ibuprofen (ADVIL) 800 MG tablet Take 800 mg by mouth every 6 (six) hours as needed. 08/18/19   [provider]    Family History Family History  Problem Relation Age of Onset  . Hypertension Mother   . Healthy Father     Social History Social History   Tobacco Use  . Smoking status: Never Smoker  . Smokeless tobacco: Never Used  Vaping Use   . Vaping Use: Never used  Substance Use Topics  . Alcohol use: Yes    Comment: socially  . Drug use: Never     Allergies   Aspirin and Bee venom   Review of Systems Review of Systems  HENT: Negative.   Gastrointestinal: Negative.  Negative for diarrhea, nausea and vomiting.  Musculoskeletal: Negative.   Skin: Positive for color change and rash. Negative for pallor and wound.  Neurological: Negative.  Negative for dizziness, light-headedness and headaches.     Physical Exam Triage Vital Signs ED Triage Vitals  Enc Vitals Group     BP 03/31/20 0906 133/84     Pulse Rate 03/31/20 0906 (!) 55     Resp 03/31/20 0906 19     Temp 03/31/20 0906 98.3 F (36.8 C)     Temp src --      SpO2 03/31/20 0906 100 %     Weight --      Height --      Head Circumference --      Peak Flow --      Pain Score 03/31/20 0905 1     Pain Loc --      Pain Edu? --      Excl. in [place]? --    No data found.  Updated Vital Signs BP 133/84   Pulse (!) 55   Temp 98.3 F (36.8 C)   Resp 19   SpO2 100%   Visual Acuity Right Eye Distance:   Left Eye Distance:   Bilateral Distance:    Right Eye Near:   Left Eye Near:    Bilateral Near:     Physical Exam Vitals and nursing note reviewed.  Constitutional:      General: He is not in acute distress.    Appearance: He is not ill-appearing.  Cardiovascular:     Rate and Rhythm: Normal rate and regular rhythm.     Pulses: Normal pulses.     Heart sounds: No murmur heard.  No friction rub.  Pulmonary:     Effort: Pulmonary effort is normal.     Breath sounds: No stridor. No wheezing.  Abdominal:     General: Bowel sounds are normal. There is no distension.     Palpations: Abdomen is soft.     Tenderness: There is no guarding or rebound.  Musculoskeletal:        General: Normal range of motion.  Skin:    General: Skin is warm.     Capillary Refill: Capillary refill takes less than 2 seconds.     Findings: Erythema present. No rash.   Neurological:     General: No focal deficit present.     Mental Status: He is alert and oriented to person, place, and time.      UC Treatments / Results  Labs (all labs ordered are listed, but only abnormal results are displayed) Labs Reviewed - No data to display  EKG   Radiology No results found.  Procedures Procedures (including critical care time)  Medications Ordered in UC Medications - No data to display  Initial Impression / Assessment and Plan / UC Course  I have reviewed the triage vital signs and the nursing notes.  Pertinent labs & imaging results that were available during my care of the patient were reviewed by me and considered in my medical decision making (see chart for details).     1.  Erythema over the neck in the sun exposed area: I do not see any rash over the neck.  There is mild erythema and the area involved is consistent with the area exposed to the sun.  This may be a sunburn.  Patient is advised to use sunscreen with the highest SPF.  Discontinue steroid use at this time Hydroxyzine as needed for itching. Return precautions given. Final Clinical Impressions(s) / UC Diagnoses   Final diagnoses:  Rash and nonspecific skin eruption   Discharge Instructions   None    ED Prescriptions    Medication Sig Dispense Auth. Provider   hydrOXYzine (ATARAX/VISTARIL) 25 MG tablet Take 1 tablet (25 mg total) by mouth every 8 (eight) hours as needed for itching. 12 tablet Lamptey, Myrene Galas, MD     PDMP not reviewed this encounter.   Chase Picket, MD 03/31/20 1017

## 2020-04-05 MED ORDER — EPINEPHRINE PF 1 MG/ML IJ SOLN
INTRAMUSCULAR | Status: AC
  Filled 2020-04-05: qty 1

## 2020-04-17 MED ORDER — POVIDONE-IODINE 10 % EX SOLN
CUTANEOUS | Status: AC
  Filled 2020-04-17: qty 118

## 2020-04-17 MED ORDER — ASPIRIN 81 MG PO CHEW
CHEWABLE_TABLET | ORAL | Status: AC
  Filled 2020-04-17: qty 4

## 2020-04-23 MED ORDER — CEFTRIAXONE SODIUM 500 MG IJ SOLR
INTRAMUSCULAR | Status: AC
  Filled 2020-04-23: qty 500

## 2020-04-26 MED ORDER — METHYLPREDNISOLONE SODIUM SUCC 125 MG IJ SOLR
INTRAMUSCULAR | Status: AC
  Filled 2020-04-26: qty 2

## 2020-05-01 MED ORDER — BACITRACIN ZINC 500 UNIT/GM EX OINT
TOPICAL_OINTMENT | CUTANEOUS | Status: AC
  Filled 2020-05-01: qty 0.9

## 2020-05-01 MED ORDER — LIDOCAINE HCL 2 % IJ SOLN
INTRAMUSCULAR | Status: AC
  Filled 2020-05-01: qty 20

## 2020-05-26 ENCOUNTER — Other Ambulatory Visit: Payer: Self-pay

## 2020-05-26 ENCOUNTER — Ambulatory Visit (INDEPENDENT_AMBULATORY_CARE_PROVIDER_SITE_OTHER): Payer: 59 | Admitting: Adult Health

## 2020-05-26 ENCOUNTER — Encounter: Payer: Self-pay | Admitting: Adult Health

## 2020-05-26 VITALS — BP 139/85 | HR 54 | Ht 67.0 in | Wt 210.0 lb

## 2020-05-26 DIAGNOSIS — F411 Generalized anxiety disorder: Secondary | ICD-10-CM | POA: Diagnosis not present

## 2020-05-26 MED ORDER — ALPRAZOLAM 0.5 MG PO TABS: 0.5000 mg | ORAL_TABLET | Freq: Every day | ORAL | 2 refills | Status: DC | PRN

## 2020-05-26 NOTE — Progress Notes (Addendum)
[facility] MD/PA/NP Initial Note  05/26/2020 10:41 AM Steve Jimenez  MRN:  [id number]  Chief Complaint:   HPI:  Describes mood today as  Mood symptoms - denies depression and irritability. Reports anxiety. Stating "I have anxiety a few times a week". Has been taking Xanax 0.5mg  on an as needed basis through PCP. PCP referred him to continue medication. Denies panic attacks. Feels anxiety related to multiple stressors over the past four years. Seeing therapist - Elnora Morrison. In 2017, sister was ran over and he had to keep her children and rebuild her home - she covered. In 2018, business partner shot himself - survived. In 2020, co-worker shot himself. In 2021, business partner filed bankruptcy without his knowledge. Started having a "pain" in his chest in 2019 - PCP sent him for testing and everything was normal. Stable interest and motivation. Taking medications as prescribed.  Energy levels stable. Active, does not have a regular exercise routine. Walks most days after dinner. Enjoys some usuaIl interests and activities. Married. Lives with wife and 4 children. Parents local. Spending time with family. Appetite adequate. Weight stable - 210 pounds. Sleeps well most nights. Averages 6 hours. Napping on the weekends.  Focus and concentration stable. Completing tasks. Managing aspects of household. 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  Denies AH or VH.  Previous medication trials: Xanax  Visit Diagnosis:    ICD-10-CM   1. Generalized anxiety disorder  F41.1 ALPRAZolam (XANAX) 0.5 MG tablet    Past Psychiatric History: Denies psychiatric hospitalization  Past Medical History:  Past Medical History:  Diagnosis Date  . Anxiety     Past Surgical History:  Procedure Laterality Date  . APPENDECTOMY      Family Psychiatric History: Denies any family history of mental illness.  Family History:  Family History  Problem Relation Age of Onset  . Hypertension Mother    . Healthy Father     Social History:  Social History   Socioeconomic History  . Marital status: Married    Spouse name: Not on file  . Number of children: Not on file  . Years of education: Not on file  . Highest education level: Not on file  Occupational History  . Not on file  Tobacco Use  . Smoking status: Never Smoker  . Smokeless tobacco: Never Used  Vaping Use  . Vaping Use: Never used  Substance and Sexual Activity  . Alcohol use: Yes    Comment: socially  . Drug use: Never  . Sexual activity: Not on file  Other Topics Concern  . Not on file  Social History Narrative  . Not on file   Social Determinants of Health   Financial Resource Strain:   . Difficulty of Paying Living Expenses: Not on file  Food Insecurity:   . Worried About Charity fundraiser in the Last Year: Not on file  . Ran Out of Food in the Last Year: Not on file  Transportation Needs:   . Lack of Transportation (Medical): Not on file  . Lack of Transportation (Non-Medical): Not on file  Physical Activity:   . Days of Exercise per Week: Not on file  . Minutes of Exercise per Session: Not on file  Stress:   . Feeling of Stress : Not on file  Social Connections:   . Frequency of Communication with Friends and Family: Not on file  . Frequency of Social Gatherings with Friends and Family: Not on file  . Attends  Religious Services: Not on file  . Active Member of Clubs or Organizations: Not on file  . Attends Archivist Meetings: Not on file  . Marital Status: Not on file    Allergies:  Allergies  Allergen Reactions  . Aspirin   . Bee Venom     Metabolic Disorder Labs: No results found for: HGBA1C, MPG No results found for: PROLACTIN No results found for: CHOL, TRIG, HDL, CHOLHDL, VLDL, LDLCALC No results found for: TSH  Therapeutic Level Labs: No results found for: LITHIUM No results found for: VALPROATE No components found for:  CBMZ  Current Medications: Current  Outpatient Medications  Medication Sig Dispense Refill  . ALPRAZolam (XANAX) 0.5 MG tablet Take 1 tablet (0.5 mg total) by mouth daily as needed. 30 tablet 2  . atorvastatin (LIPITOR) 20 MG tablet Take 20 mg by mouth daily.    . Coenzyme Q10 200 MG capsule Take 1 capsule (200 mg total) by mouth daily. 90 capsule 3  . hydrOXYzine (ATARAX/VISTARIL) 25 MG tablet Take 1 tablet (25 mg total) by mouth every 8 (eight) hours as needed for itching. 12 tablet 0  . ibuprofen (ADVIL) 800 MG tablet Take 800 mg by mouth every 6 (six) hours as needed.     No current facility-administered medications for this visit.    Medication Side Effects: none  Orders placed this visit:  No orders of the defined types were placed in this encounter.   Psychiatric Specialty Exam:  Review of Systems  Blood pressure 139/85, pulse (!) 54, height 5\' 7"  (1.702 m), weight 210 lb (95.3 kg).Body mass index is 32.89 kg/m.  General Appearance: Neat and Well Groomed  Eye Contact:  Good  Speech:  Clear and Coherent and Normal Rate  Volume:  Normal  Mood:  Anxious  Affect:  Appropriate and Congruent  Thought Process:  Coherent and Descriptions of Associations: Intact  Orientation:  Full (Time, Place, and Person)  Thought Content: Logical   Suicidal Thoughts:  No  Homicidal Thoughts:  No  Memory:  WNL  Judgement:  Good  Insight:  Good  Psychomotor Activity:  Normal  Concentration:  Concentration: Good  Recall:  Good  Fund of Knowledge: Good  Language: Good  Assets:  Communication Skills Desire for Improvement Financial Resources/Insurance Housing Intimacy Leisure Time Physical Health Resilience Social Support Talents/Skills Transportation Vocational/Educational  ADL's:  Intact  Cognition: WNL  Prognosis:  Good   Screenings: MDQ  Receiving Psychotherapy: Yes   Treatment Plan/Recommendations:   Plan:  PDMP reviewed  1. Add Xanax 0.5mg  daily PRN anxiety  Read and reviewed note with patient for  accuracy.   RTC 3 months  Patient advised to contact office with any questions, adverse effects, or acute worsening in signs and symptoms.  Discussed potential benefits, risk, and side effects of benzodiazepines to include potential risk of tolerance and dependence, as well as possible drowsiness.  Advised patient not to drive if experiencing drowsiness and to take lowest possible effective dose to minimize risk of dependence and tolerance.  Greater than 50% of face to face time with patient was spent on counseling and coordination of care. We discussed anxiety and panic attacks. Discussed continuing to seeing his therapist.    Aloha Gell, NP

## 2020-07-02 ENCOUNTER — Other Ambulatory Visit: Payer: Self-pay | Admitting: Cardiovascular Disease

## 2020-08-26 ENCOUNTER — Encounter: Payer: Self-pay | Admitting: Adult Health

## 2020-08-26 ENCOUNTER — Ambulatory Visit (INDEPENDENT_AMBULATORY_CARE_PROVIDER_SITE_OTHER): Payer: 59 | Admitting: Adult Health

## 2020-08-26 ENCOUNTER — Other Ambulatory Visit: Payer: Self-pay

## 2020-08-26 DIAGNOSIS — F411 Generalized anxiety disorder: Secondary | ICD-10-CM

## 2020-08-26 MED ORDER — ALPRAZOLAM 0.5 MG PO TABS: 0.5000 mg | ORAL_TABLET | Freq: Every day | ORAL | 2 refills | Status: DC | PRN

## 2020-08-26 NOTE — Progress Notes (Signed)
Steve Jimenez [id number] Oct 28, 1980 39 y.o.  Subjective:   Patient ID:  Steve Jimenez is a 39 y.o. (DOB 1981/05/25) male.  Chief Complaint: No chief complaint on file.   HPI Steve Jimenez presents to the office today for follow-up of anxiety.  Describes mood today as "ok". Pleasant. Mood symptoms - denies depression and irritability. Reports anxiety at times.  Stating "I'm doing alright". Using Xanax a few times a week. Denies panic attacks. Seeing therapist - Steve Jimenez. Stable interest and motivation. Taking medications as prescribed.  Energy levels stable. Active, does not have a regular exercise routine.  Enjoys some usuaIl interests and activities. Married. Lives with wife and 4 children. Parents local. Spending time with family. Appetite adequate. Weight gain - 6 pounds - 216 pounds. Sleeps well most nights. Averages 6 hours.  Focus and concentration stable. Completing tasks. Managing aspects of household. 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  Denies AH or VH.  Previous medication trials: Xanax     Review of Systems:  Review of Systems  Musculoskeletal: Negative for gait problem.  Neurological: Negative for tremors.  Psychiatric/Behavioral:       Please refer to HPI    Medications: I have reviewed the patient's current medications.  Current Outpatient Medications  Medication Sig Dispense Refill  . ALPRAZolam (XANAX) 0.5 MG tablet Take 1 tablet (0.5 mg total) by mouth daily as needed. 30 tablet 2  . atorvastatin (LIPITOR) 20 MG tablet TAKE 1 TABLET BY MOUTH EVERY DAY 90 tablet 3  . Coenzyme Q10 200 MG capsule Take 1 capsule (200 mg total) by mouth daily. 90 capsule 3  . hydrOXYzine (ATARAX/VISTARIL) 25 MG tablet Take 1 tablet (25 mg total) by mouth every 8 (eight) hours as needed for itching. 12 tablet 0  . ibuprofen (ADVIL) 800 MG tablet Take 800 mg by mouth every 6 (six) hours as needed.     No current facility-administered medications  for this visit.    Medication Side Effects: None  Allergies:  Allergies  Allergen Reactions  . Aspirin   . Bee Venom     Past Medical History:  Diagnosis Date  . Anxiety     Family History  Problem Relation Age of Onset  . Hypertension Mother   . Healthy Father     Social History   Socioeconomic History  . Marital status: Married    Spouse name: Not on file  . Number of children: Not on file  . Years of education: Not on file  . Highest education level: Not on file  Occupational History  . Not on file  Tobacco Use  . Smoking status: Never Smoker  . Smokeless tobacco: Never Used  Vaping Use  . Vaping Use: Never used  Substance and Sexual Activity  . Alcohol use: Yes    Comment: socially  . Drug use: Never  . Sexual activity: Not on file  Other Topics Concern  . Not on file  Social History Narrative  . Not on file   Social Determinants of Health   Financial Resource Strain:   . Difficulty of Paying Living Expenses: Not on file  Food Insecurity:   . Worried About Charity fundraiser in the Last Year: Not on file  . Ran Out of Food in the Last Year: Not on file  Transportation Needs:   . Lack of Transportation (Medical): Not on file  . Lack of Transportation (Non-Medical): Not on file  Physical Activity:   .  Days of Exercise per Week: Not on file  . Minutes of Exercise per Session: Not on file  Stress:   . Feeling of Stress : Not on file  Social Connections:   . Frequency of Communication with Friends and Family: Not on file  . Frequency of Social Gatherings with Friends and Family: Not on file  . Attends Religious Services: Not on file  . Active Member of Clubs or Organizations: Not on file  . Attends Archivist Meetings: Not on file  . Marital Status: Not on file  Intimate Partner Violence:   . Fear of Current or Ex-Partner: Not on file  . Emotionally Abused: Not on file  . Physically Abused: Not on file  . Sexually Abused: Not on file     Past Medical History, Surgical history, Social history, and Family history were reviewed and updated as appropriate.   Please see review of systems for further details on the patient's review from today.   Objective:   Physical Exam:  There were no vitals taken for this visit.  Physical Exam Constitutional:      General: He is not in acute distress. Musculoskeletal:        General: No deformity.  Neurological:     Mental Status: He is alert and oriented to person, place, and time.     Coordination: Coordination normal.  Psychiatric:        Attention and Perception: Attention and perception normal. He does not perceive auditory or visual hallucinations.        Mood and Affect: Mood normal. Mood is not anxious or depressed. Affect is not labile, blunt, angry or inappropriate.        Speech: Speech normal.        Behavior: Behavior normal.        Thought Content: Thought content normal. Thought content is not paranoid or delusional. Thought content does not include homicidal or suicidal ideation. Thought content does not include homicidal or suicidal plan.        Cognition and Memory: Cognition and memory normal.        Judgment: Judgment normal.     Comments: Insight intact     Lab Review:  No results found for: NA, K, CL, CO2, GLUCOSE, BUN, CREATININE, CALCIUM, PROT, ALBUMIN, AST, ALT, ALKPHOS, BILITOT, GFRNONAA, GFRAA  No results found for: WBC, RBC, HGB, HCT, PLT, MCV, MCH, MCHC, RDW, LYMPHSABS, MONOABS, EOSABS, BASOSABS  No results found for: POCLITH, LITHIUM   No results found for: PHENYTOIN, PHENOBARB, VALPROATE, CBMZ   .res Assessment: Plan:    Plan:  PDMP reviewed  1. Add Xanax 0.5mg  daily PRN anxiety  Read and reviewed note with patient for accuracy.   RTC 6 months  Patient advised to contact office with any questions, adverse effects, or acute worsening in signs and symptoms.  Discussed potential benefits, risk, and side effects of benzodiazepines to  include potential risk of tolerance and dependence, as well as possible drowsiness.  Advised patient not to drive if experiencing drowsiness and to take lowest possible effective dose to minimize risk of dependence and tolerance.  Greater than 50% of face to face time with patient was spent on counseling and coordination of care. We discussed anxiety and panic attacks. Discussed continuing to seeing his therapist.   Diagnoses and all orders for this visit:  Generalized anxiety disorder -     ALPRAZolam (XANAX) 0.5 MG tablet; Take 1 tablet (0.5 mg total) by mouth daily as needed.  Please see After Visit Summary for patient specific instructions.  No future appointments.  No orders of the defined types were placed in this encounter.   -------------------------------

## 2020-11-28 ENCOUNTER — Ambulatory Visit (INDEPENDENT_AMBULATORY_CARE_PROVIDER_SITE_OTHER): Payer: 59 | Admitting: Adult Health

## 2020-11-28 ENCOUNTER — Other Ambulatory Visit: Payer: Self-pay

## 2020-11-28 ENCOUNTER — Encounter: Payer: Self-pay | Admitting: Adult Health

## 2020-11-28 DIAGNOSIS — M5430 Sciatica, unspecified side: Secondary | ICD-10-CM | POA: Insufficient documentation

## 2020-11-28 DIAGNOSIS — F411 Generalized anxiety disorder: Secondary | ICD-10-CM

## 2020-11-28 DIAGNOSIS — F430 Acute stress reaction: Secondary | ICD-10-CM | POA: Insufficient documentation

## 2020-11-28 DIAGNOSIS — E785 Hyperlipidemia, unspecified: Secondary | ICD-10-CM | POA: Insufficient documentation

## 2020-11-28 MED ORDER — ALPRAZOLAM 0.5 MG PO TABS: 0.5000 mg | ORAL_TABLET | Freq: Every day | ORAL | 2 refills | Status: DC | PRN

## 2020-11-28 NOTE — Progress Notes (Signed)
Antaeus Karel Delbarton [id number] 21-Dec-1980 40 y.o.  Subjective:   Patient ID:  Steve Jimenez is a 40 y.o. (DOB 1981/03/05) male.  Chief Complaint: No chief complaint on file.   HPI Steve Jimenez presents to the office today for follow-up of anxiety.  Describes mood today as "ok". Pleasant. Mood symptoms - denies depression and irritability. Reports anxiety at times.  Denies panic attacks.- "pain in my chest sometimes". Stating "I'm doing fine".Seeing therapist - Elnora Morrison. Stable interest and motivation. Taking medications as prescribed.  Energy levels stable. Active, does not have a regular exercise routine.  Enjoys some usuaIl interests and activities. Married. Lives with wife and 4 children. Parents local. Spending time with family. Appetite adequate. Weight stable - 216 pounds. Sleeps well most nights. Averages 6 hours.  Focus and concentration stable. Completing tasks. Managing aspects of household. 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  Denies AH or VH.  Previous medication trials: Xanax   Review of Systems:  Review of Systems  Musculoskeletal: Negative for gait problem.  Neurological: Negative for tremors.  Psychiatric/Behavioral:       Please refer to HPI    Medications: I have reviewed the patient's current medications.  Current Outpatient Medications  Medication Sig Dispense Refill  . ALPRAZolam (XANAX) 0.5 MG tablet Take 1 tablet (0.5 mg total) by mouth daily as needed. 30 tablet 2  . atorvastatin (LIPITOR) 20 MG tablet TAKE 1 TABLET BY MOUTH EVERY DAY 90 tablet 3  . Coenzyme Q10 200 MG capsule Take 1 capsule (200 mg total) by mouth daily. 90 capsule 3  . hydrOXYzine (ATARAX/VISTARIL) 25 MG tablet Take 1 tablet (25 mg total) by mouth every 8 (eight) hours as needed for itching. 12 tablet 0  . ibuprofen (ADVIL) 800 MG tablet Take 800 mg by mouth every 6 (six) hours as needed.     No current facility-administered medications for this visit.     Medication Side Effects: None  Allergies:  Allergies  Allergen Reactions  . Other Anaphylaxis  . Aspirin     Other reaction(s): as a child  . Bee Venom     Past Medical History:  Diagnosis Date  . Anxiety     Family History  Problem Relation Age of Onset  . Hypertension Mother   . Healthy Father     Social History   Socioeconomic History  . Marital status: Married    Spouse name: Not on file  . Number of children: Not on file  . Years of education: Not on file  . Highest education level: Not on file  Occupational History  . Not on file  Tobacco Use  . Smoking status: Never Smoker  . Smokeless tobacco: Never Used  Vaping Use  . Vaping Use: Never used  Substance and Sexual Activity  . Alcohol use: Yes    Comment: socially  . Drug use: Never  . Sexual activity: Not on file  Other Topics Concern  . Not on file  Social History Narrative  . Not on file   Social Determinants of Health   Financial Resource Strain: Not on file  Food Insecurity: Not on file  Transportation Needs: Not on file  Physical Activity: Not on file  Stress: Not on file  Social Connections: Not on file  Intimate Partner Violence: Not on file    Past Medical History, Surgical history, Social history, and Family history were reviewed and updated as appropriate.   Please see review of systems for  further details on the patient's review from today.   Objective:   Physical Exam:  There were no vitals taken for this visit.  Physical Exam Constitutional:      General: He is not in acute distress. Musculoskeletal:        General: No deformity.  Neurological:     Mental Status: He is alert and oriented to person, place, and time.     Coordination: Coordination normal.  Psychiatric:        Attention and Perception: Attention and perception normal. He does not perceive auditory or visual hallucinations.        Mood and Affect: Mood normal. Mood is not anxious or depressed. Affect  is not labile, blunt, angry or inappropriate.        Speech: Speech normal.        Behavior: Behavior normal.        Thought Content: Thought content normal. Thought content is not paranoid or delusional. Thought content does not include homicidal or suicidal ideation. Thought content does not include homicidal or suicidal plan.        Cognition and Memory: Cognition and memory normal.        Judgment: Judgment normal.     Comments: Insight intact     Lab Review:  No results found for: NA, K, CL, CO2, GLUCOSE, BUN, CREATININE, CALCIUM, PROT, ALBUMIN, AST, ALT, ALKPHOS, BILITOT, GFRNONAA, GFRAA  No results found for: WBC, RBC, HGB, HCT, PLT, MCV, MCH, MCHC, RDW, LYMPHSABS, MONOABS, EOSABS, BASOSABS  No results found for: POCLITH, LITHIUM   No results found for: PHENYTOIN, PHENOBARB, VALPROATE, CBMZ   .res Assessment: Plan:     Plan:  PDMP reviewed  1. Add Xanax 0.5mg  daily PRN anxiety  Read and reviewed note with patient for accuracy.   RTC 6 months - will call in 3 months for refills.  Patient advised to contact office with any questions, adverse effects, or acute worsening in signs and symptoms.  Discussed potential benefits, risk, and side effects of benzodiazepines to include potential risk of tolerance and dependence, as well as possible drowsiness.  Advised patient not to drive if experiencing drowsiness and to take lowest possible effective dose to minimize risk of dependence and tolerance.  Greater than 50% of face to face time with patient was spent on counseling and coordination of care. We discussed anxiety and panic attacks. Discussed continuing to seeing his therapist.     Diagnoses and all orders for this visit:  Generalized anxiety disorder -     ALPRAZolam (XANAX) 0.5 MG tablet; Take 1 tablet (0.5 mg total) by mouth daily as needed.     Please see After Visit Summary for patient specific instructions.  No future appointments.  No orders of the  defined types were placed in this encounter.   -------------------------------

## 2021-01-13 ENCOUNTER — Other Ambulatory Visit: Payer: Self-pay | Admitting: Urology

## 2021-01-13 ENCOUNTER — Other Ambulatory Visit (HOSPITAL_COMMUNITY): Payer: Self-pay | Admitting: Urology

## 2021-01-13 DIAGNOSIS — R310 Gross hematuria: Secondary | ICD-10-CM

## 2021-01-16 ENCOUNTER — Ambulatory Visit (HOSPITAL_COMMUNITY): Payer: Self-pay

## 2021-01-20 ENCOUNTER — Other Ambulatory Visit: Payer: Self-pay

## 2021-01-20 ENCOUNTER — Ambulatory Visit (HOSPITAL_COMMUNITY)
Admission: RE | Admit: 2021-01-20 | Discharge: 2021-01-20 | Disposition: A | Discharge status: Home / Self Care | Payer: Self-pay | Source: Ambulatory Visit | Attending: Urology | Admitting: Urology

## 2021-01-20 DIAGNOSIS — R310 Gross hematuria: Secondary | ICD-10-CM | POA: Insufficient documentation

## 2021-01-20 MED ORDER — SODIUM CHLORIDE 0.9 % IV SOLN
INTRAVENOUS | Status: AC
  Filled 2021-01-20: qty 250

## 2021-01-20 MED ORDER — IOHEXOL 300 MG/ML  SOLN
100.0000 mL | Freq: Once | INTRAMUSCULAR | Status: AC | PRN
  Administered 2021-01-20: 100 mL via INTRAVENOUS

## 2021-02-05 ENCOUNTER — Other Ambulatory Visit: Payer: Self-pay | Admitting: Urology

## 2021-02-17 ENCOUNTER — Other Ambulatory Visit: Payer: Self-pay

## 2021-02-17 ENCOUNTER — Telehealth: Payer: Self-pay | Admitting: Adult Health

## 2021-02-17 DIAGNOSIS — F411 Generalized anxiety disorder: Secondary | ICD-10-CM

## 2021-02-17 MED ORDER — ALPRAZOLAM 0.5 MG PO TABS: 0.5000 mg | ORAL_TABLET | Freq: Every day | ORAL | 2 refills | Status: DC | PRN

## 2021-02-17 NOTE — Telephone Encounter (Signed)
Last filled 01/28/21.pended

## 2021-02-17 NOTE — Telephone Encounter (Signed)
Steve Jimenez called in refill for Alprazolam 0.5mg . Appt 8/19. Pharmacy [facility] [id number] [place]

## 2021-02-17 NOTE — Telephone Encounter (Signed)
Script sent  

## 2021-03-19 DIAGNOSIS — D49512 Neoplasm of unspecified behavior of left kidney: Secondary | ICD-10-CM | POA: Diagnosis not present

## 2021-03-26 NOTE — Patient Instructions (Addendum)
DUE TO COVID-19 ONLY ONE VISITOR IS ALLOWED TO COME WITH YOU AND STAY IN THE WAITING ROOM ONLY DURING PRE OP AND PROCEDURE DAY OF SURGERY. THE 2 VISITORS  MAY VISIT WITH YOU AFTER SURGERY IN YOUR PRIVATE ROOM DURING VISITING HOURS ONLY!  YOU NEED TO HAVE A COVID 19 TEST ON_6/29______ @__10 :30_____, THIS TEST MUST BE DONE BEFORE SURGERY,  COVID TESTING SITE [name] [id number], IT IS ON THE RIGHT GOING OUT [address] APPROXIMATELY  2 MINUTES PAST [employer] ON THE RIGHT. ONCE YOUR COVID TEST IS COMPLETED,  PLEASE BEGIN THE QUARANTINE INSTRUCTIONS AS OUTLINED IN YOUR HANDOUT.                [place]     Your procedure is scheduled on: 04/03/21   Report to [facility] Main  Entrance   Report to admitting at  10:30 AM     Call this number if you have problems the morning of surgery [phone]    Remember: Do not eat food after Midnight.   You may have clear liquids until 7:00 am  BRUSH YOUR TEETH MORNING OF SURGERY AND RINSE YOUR MOUTH OUT, NO CHEWING GUM CANDY OR MINTS.     Take these medicines the morning of surgery with A SIP OF WATER: Xanax if needed                               You may not have any metal on your body including hair              piercings  Do not wear jewelry, lotions, powders or deodorant                         Men may shave face and neck.   Do not bring valuables to the hospital. [place].  Contacts, dentures or bridgework may not be worn into surgery.     _____________________________________________________________________             [facility] - Preparing for Surgery Before surgery, you can play an important role.  Because skin is not sterile, your skin needs to be as free of germs as possible.  You can reduce the number of germs on your skin by washing with CHG (chlorahexidine gluconate) soap before surgery.  CHG is an antiseptic cleaner which  kills germs and bonds with the skin to continue killing germs even after washing. Please DO NOT use if you have an allergy to CHG or antibacterial soaps.  If your skin becomes reddened/irritated stop using the CHG and inform your nurse when you arrive at Short Stay.   You may shave your face/neck.  Please follow these instructions carefully:  1.  Shower with CHG Soap the night before surgery and the  morning of Surgery.  2.  If you choose to wash your hair, wash your hair first as usual with your  normal  shampoo.  3.  After you shampoo, rinse your hair and body thoroughly to remove the  shampoo.                                        4.  Use CHG as you would any other liquid soap.  You can apply chg directly  to the skin and wash                       Gently with a scrungie or clean washcloth.  5.  Apply the CHG Soap to your body ONLY FROM THE NECK DOWN.   Do not use on face/ open                           Wound or open sores. Avoid contact with eyes, ears mouth and genitals (private parts).                       Wash face,  Genitals (private parts) with your normal soap.             6.  Wash thoroughly, paying special attention to the area where your surgery  will be performed.  7.  Thoroughly rinse your body with warm water from the neck down.  8.  DO NOT shower/wash with your normal soap after using and rinsing off  the CHG Soap.             9.  Pat yourself dry with a clean towel.            10.  Wear clean pajamas.            11.  Place clean sheets on your bed the night of your first shower and do not  sleep with pets. Day of Surgery : Do not apply any lotions/deodorants the morning of surgery.  Please wear clean clothes to the hospital/surgery center.  FAILURE TO FOLLOW THESE INSTRUCTIONS MAY RESULT IN THE CANCELLATION OF YOUR SURGERY PATIENT SIGNATURE_________________________________  NURSE  SIGNATURE__________________________________  ________________________________________________________________________   Adam Phenix  An incentive spirometer is a tool that can help keep your lungs clear and active. This tool measures how well you are filling your lungs with each breath. Taking long deep breaths may help reverse or decrease the chance of developing breathing (pulmonary) problems (especially infection) following: A long period of time when you are unable to move or be active. BEFORE THE PROCEDURE  If the spirometer includes an indicator to show your best effort, your nurse or respiratory therapist will set it to a desired goal. If possible, sit up straight or lean slightly forward. Try not to slouch. Hold the incentive spirometer in an upright position. INSTRUCTIONS FOR USE  Sit on the edge of your bed if possible, or sit up as far as you can in bed or on a chair. Hold the incentive spirometer in an upright position. Breathe out normally. Place the mouthpiece in your mouth and seal your lips tightly around it. Breathe in slowly and as deeply as possible, raising the piston or the ball toward the top of the column. Hold your breath for 3-5 seconds or for as long as possible. Allow the piston or ball to fall to the bottom of the column. Remove the mouthpiece from your mouth and breathe out normally. Rest for a few seconds and repeat Steps 1 through 7 at least 10 times every 1-2 hours when you are awake. Take your time and take a few normal breaths between deep breaths. The spirometer may include an indicator to show your best effort. Use the indicator as a goal to work toward during each repetition. After each set  of 10 deep breaths, practice coughing to be sure your lungs are clear. If you have an incision (the cut made at the time of surgery), support your incision when coughing by placing a pillow or rolled up towels firmly against it. Once you are able to get out of  bed, walk around indoors and cough well. You may stop using the incentive spirometer when instructed by your caregiver.  RISKS AND COMPLICATIONS Take your time so you do not get dizzy or light-headed. If you are in pain, you may need to take or ask for pain medication before doing incentive spirometry. It is harder to take a deep breath if you are having pain. AFTER USE Rest and breathe slowly and easily. It can be helpful to keep track of a log of your progress. Your caregiver can provide you with a simple table to help with this. If you are using the spirometer at home, follow these instructions: [place] IF:  You are having difficultly using the spirometer. You have trouble using the spirometer as often as instructed. Your pain medication is not giving enough relief while using the spirometer. You develop fever of 100.5 F (38.1 C) or higher. SEEK IMMEDIATE MEDICAL CARE IF:  You cough up bloody sputum that had not been present before. You develop fever of 102 F (38.9 C) or greater. You develop worsening pain at or near the incision site. MAKE SURE YOU:  Understand these instructions. Will watch your condition. Will get help right away if you are not doing well or get worse. Document Released: 01/31/2007 Document Revised: 12/13/2011 Document Reviewed: 04/03/2007 Exit[employer] Patient Information 2014 [employer], [place].   ________________________________________________________________________  WHAT IS A BLOOD TRANSFUSION? Blood Transfusion Information  A transfusion is the replacement of blood or some of its parts. Blood is made up of multiple cells which provide different functions. Red blood cells carry oxygen and are used for blood loss replacement. White blood cells fight against infection. Platelets control bleeding. Plasma helps clot blood. Other blood products are available for specialized needs, such as hemophilia or other clotting disorders. BEFORE THE TRANSFUSION   Who gives blood for transfusions?  Healthy volunteers who are fully evaluated to make sure their blood is safe. This is blood bank blood. Transfusion therapy is the safest it has ever been in the practice of medicine. Before blood is taken from a donor, a complete history is taken to make sure that person has no history of diseases nor engages in risky social behavior (examples are intravenous drug use or sexual activity with multiple partners). The donor's travel history is screened to minimize risk of transmitting infections, such as malaria. The donated blood is tested for signs of infectious diseases, such as HIV and hepatitis. The blood is then tested to be sure it is compatible with you in order to minimize the chance of a transfusion reaction. If you or a relative donates blood, this is often done in anticipation of surgery and is not appropriate for emergency situations. It takes many days to process the donated blood. RISKS AND COMPLICATIONS Although transfusion therapy is very safe and saves many lives, the main dangers of transfusion include:  Getting an infectious disease. Developing a transfusion reaction. This is an allergic reaction to something in the blood you were given. Every precaution is taken to prevent this. The decision to have a blood transfusion has been considered carefully by your caregiver before blood is given. Blood is not given unless the benefits outweigh the  risks. AFTER THE TRANSFUSION Right after receiving a blood transfusion, you will usually feel much better and more energetic. This is especially true if your red blood cells have gotten low (anemic). The transfusion raises the level of the red blood cells which carry oxygen, and this usually causes an energy increase. The nurse administering the transfusion will monitor you carefully for complications. HOME CARE INSTRUCTIONS  No special instructions are needed after a transfusion. You may find your energy is  better. Speak with your caregiver about any limitations on activity for underlying diseases you may have. SEEK MEDICAL CARE IF:  Your condition is not improving after your transfusion. You develop redness or irritation at the intravenous (IV) site. SEEK IMMEDIATE MEDICAL CARE IF:  Any of the following symptoms occur over the next 12 hours: Shaking chills. You have a temperature by mouth above 102 F (38.9 C), not controlled by medicine. Chest, back, or muscle pain. People around you feel you are not acting correctly or are confused. Shortness of breath or difficulty breathing. Dizziness and fainting. You get a rash or develop hives. You have a decrease in urine output. Your urine turns a dark color or changes to pink, red, or brown. Any of the following symptoms occur over the next 10 days: You have a temperature by mouth above 102 F (38.9 C), not controlled by medicine. Shortness of breath. Weakness after normal activity. The white part of the eye turns yellow (jaundice). You have a decrease in the amount of urine or are urinating less often. Your urine turns a dark color or changes to pink, red, or brown. Document Released: 09/17/2000 Document Revised: 12/13/2011 Document Reviewed: 05/06/2008 [facility] Patient Information 2014 [place], [place].  _______________________________________________________________________

## 2021-03-27 ENCOUNTER — Encounter (HOSPITAL_COMMUNITY): Payer: Self-pay

## 2021-03-27 ENCOUNTER — Encounter (HOSPITAL_COMMUNITY)
Admission: RE | Admit: 2021-03-27 | Discharge: 2021-03-27 | Disposition: A | Discharge status: Home / Self Care | Payer: BC Managed Care – PPO | Source: Ambulatory Visit | Attending: Urology | Admitting: Urology

## 2021-03-27 ENCOUNTER — Other Ambulatory Visit: Payer: Self-pay

## 2021-03-27 DIAGNOSIS — Z01812 Encounter for preprocedural laboratory examination: Secondary | ICD-10-CM | POA: Diagnosis not present

## 2021-03-27 LAB — CBC
HCT: 44.1 % (ref 39.0–52.0)
Hemoglobin: 14.6 g/dL (ref 13.0–17.0)
MCH: 30.5 pg (ref 26.0–34.0)
MCHC: 33.1 g/dL (ref 30.0–36.0)
MCV: 92.3 fL (ref 80.0–100.0)
Platelets: 355 10*3/uL (ref 150–400)
RBC: 4.78 MIL/uL (ref 4.22–5.81)
RDW: 11.9 % (ref 11.5–15.5)
WBC: 5 10*3/uL (ref 4.0–10.5)
nRBC: 0 % (ref 0.0–0.2)

## 2021-03-27 LAB — BASIC METABOLIC PANEL
Anion gap: 9 (ref 5–15)
BUN: 14 mg/dL (ref 6–20)
CO2: 25 mmol/L (ref 22–32)
Calcium: 9.6 mg/dL (ref 8.9–10.3)
Chloride: 104 mmol/L (ref 98–111)
Creatinine, Ser: 0.83 mg/dL (ref 0.61–1.24)
GFR, Estimated: >60 mL/min (ref >60)
Glucose, Bld: 100 mg/dL — ABNORMAL HIGH (ref 70–99)
Potassium: 4.3 mmol/L (ref 3.5–5.1)
Sodium: 138 mmol/L (ref 135–145)

## 2021-03-27 NOTE — Progress Notes (Signed)
COVID Vaccine Completed:Yes Date COVID Vaccine completed:09/23/20 COVID vaccine manufacturer: [employer]   PCP - [name]. LOV 04/14/20 Cardiologist - none  Chest x-ray - no EKG - no Stress Test - no ECHO - no Cardiac Cath - no Pacemaker/ICD device last checked:NA  Sleep Study - yes-negative CPAP - no  Fasting Blood Sugar - NA Checks Blood Sugar _____ times a day  Blood Thinner Instructions:NA Aspirin Instructions: Last Dose:  Anesthesia review: no  Patient denies shortness of breath, fever, cough and chest pain at PAT appointment Yes. Pt has no SOB with any activities. He had chest pain 07/24/19. Work up was done and it was due to Anxiety. None since then  Patient verbalized understanding of instructions that were given to them at the PAT appointment. Patient was also instructed that they will need to review over the PAT instructions again at home before surgery. yes

## 2021-04-01 ENCOUNTER — Other Ambulatory Visit (HOSPITAL_COMMUNITY)
Admission: RE | Admit: 2021-04-01 | Discharge: 2021-04-01 | Disposition: A | Discharge status: Home / Self Care | Payer: BC Managed Care – PPO | Source: Ambulatory Visit | Attending: Urology | Admitting: Urology

## 2021-04-01 ENCOUNTER — Other Ambulatory Visit (HOSPITAL_COMMUNITY): Payer: Self-pay | Admitting: Urology

## 2021-04-01 ENCOUNTER — Other Ambulatory Visit: Payer: Self-pay

## 2021-04-01 ENCOUNTER — Ambulatory Visit (HOSPITAL_COMMUNITY)
Admission: RE | Admit: 2021-04-01 | Discharge: 2021-04-01 | Disposition: A | Discharge status: Home / Self Care | Payer: BC Managed Care – PPO | Source: Ambulatory Visit | Attending: Urology | Admitting: Urology

## 2021-04-01 DIAGNOSIS — K219 Gastro-esophageal reflux disease without esophagitis: Secondary | ICD-10-CM | POA: Diagnosis not present

## 2021-04-01 DIAGNOSIS — Z6836 Body mass index (BMI) 36.0-36.9, adult: Secondary | ICD-10-CM | POA: Diagnosis not present

## 2021-04-01 DIAGNOSIS — D49512 Neoplasm of unspecified behavior of left kidney: Secondary | ICD-10-CM | POA: Diagnosis not present

## 2021-04-01 DIAGNOSIS — M5430 Sciatica, unspecified side: Secondary | ICD-10-CM | POA: Diagnosis not present

## 2021-04-01 DIAGNOSIS — N2889 Other specified disorders of kidney and ureter: Secondary | ICD-10-CM | POA: Diagnosis not present

## 2021-04-01 DIAGNOSIS — F419 Anxiety disorder, unspecified: Secondary | ICD-10-CM | POA: Diagnosis not present

## 2021-04-01 DIAGNOSIS — E785 Hyperlipidemia, unspecified: Secondary | ICD-10-CM | POA: Diagnosis not present

## 2021-04-01 DIAGNOSIS — K668 Other specified disorders of peritoneum: Secondary | ICD-10-CM | POA: Diagnosis not present

## 2021-04-01 DIAGNOSIS — N280 Ischemia and infarction of kidney: Secondary | ICD-10-CM | POA: Diagnosis not present

## 2021-04-01 DIAGNOSIS — C642 Malignant neoplasm of left kidney, except renal pelvis: Secondary | ICD-10-CM | POA: Diagnosis not present

## 2021-04-01 DIAGNOSIS — F430 Acute stress reaction: Secondary | ICD-10-CM | POA: Diagnosis not present

## 2021-04-01 DIAGNOSIS — Z9889 Other specified postprocedural states: Secondary | ICD-10-CM | POA: Diagnosis not present

## 2021-04-01 DIAGNOSIS — Z20822 Contact with and (suspected) exposure to covid-19: Secondary | ICD-10-CM | POA: Diagnosis not present

## 2021-04-01 DIAGNOSIS — I10 Essential (primary) hypertension: Secondary | ICD-10-CM | POA: Diagnosis not present

## 2021-04-01 DIAGNOSIS — Z905 Acquired absence of kidney: Secondary | ICD-10-CM | POA: Diagnosis not present

## 2021-04-01 DIAGNOSIS — E669 Obesity, unspecified: Secondary | ICD-10-CM | POA: Diagnosis not present

## 2021-04-01 DIAGNOSIS — Z87891 Personal history of nicotine dependence: Secondary | ICD-10-CM | POA: Diagnosis not present

## 2021-04-01 LAB — SARS CORONAVIRUS 2 (TAT 6-24 HRS): SARS Coronavirus 2: NEGATIVE

## 2021-04-03 ENCOUNTER — Inpatient Hospital Stay (HOSPITAL_COMMUNITY): Payer: BC Managed Care – PPO | Admitting: Anesthesiology

## 2021-04-03 ENCOUNTER — Inpatient Hospital Stay (HOSPITAL_COMMUNITY)
Admission: AD | Admit: 2021-04-03 | Discharge: 2021-04-04 | DRG: 658 | Disposition: A | Discharge status: Home / Self Care | Payer: BC Managed Care – PPO | Attending: Urology | Admitting: Urology

## 2021-04-03 ENCOUNTER — Encounter (HOSPITAL_COMMUNITY): Payer: Self-pay | Admitting: Urology

## 2021-04-03 ENCOUNTER — Encounter (HOSPITAL_COMMUNITY)
Admission: AD | Disposition: A | Discharge status: Home / Self Care | Payer: Self-pay | Source: Home / Self Care | Attending: Urology

## 2021-04-03 DIAGNOSIS — E669 Obesity, unspecified: Secondary | ICD-10-CM | POA: Diagnosis present

## 2021-04-03 DIAGNOSIS — F419 Anxiety disorder, unspecified: Secondary | ICD-10-CM | POA: Diagnosis present

## 2021-04-03 DIAGNOSIS — Z6836 Body mass index (BMI) 36.0-36.9, adult: Secondary | ICD-10-CM

## 2021-04-03 DIAGNOSIS — Z87891 Personal history of nicotine dependence: Secondary | ICD-10-CM

## 2021-04-03 DIAGNOSIS — Z20822 Contact with and (suspected) exposure to covid-19: Secondary | ICD-10-CM | POA: Diagnosis present

## 2021-04-03 DIAGNOSIS — C642 Malignant neoplasm of left kidney, except renal pelvis: Principal | ICD-10-CM | POA: Diagnosis present

## 2021-04-03 DIAGNOSIS — N2889 Other specified disorders of kidney and ureter: Secondary | ICD-10-CM | POA: Diagnosis present

## 2021-04-03 DIAGNOSIS — R0789 Other chest pain: Secondary | ICD-10-CM

## 2021-04-03 DIAGNOSIS — R079 Chest pain, unspecified: Secondary | ICD-10-CM

## 2021-04-03 DIAGNOSIS — I10 Essential (primary) hypertension: Secondary | ICD-10-CM | POA: Diagnosis present

## 2021-04-03 DIAGNOSIS — K219 Gastro-esophageal reflux disease without esophagitis: Secondary | ICD-10-CM | POA: Diagnosis present

## 2021-04-03 HISTORY — PX: ROBOT ASSISTED LAPAROSCOPIC NEPHRECTOMY: SHX5140

## 2021-04-03 LAB — BASIC METABOLIC PANEL
Anion gap: 7 (ref 5–15)
BUN: 15 mg/dL (ref 6–20)
CO2: 28 mmol/L (ref 22–32)
Calcium: 9.3 mg/dL (ref 8.9–10.3)
Chloride: 103 mmol/L (ref 98–111)
Creatinine, Ser: 1.01 mg/dL (ref 0.61–1.24)
GFR, Estimated: >60 mL/min (ref >60)
Glucose, Bld: 107 mg/dL — ABNORMAL HIGH (ref 70–99)
Potassium: 4 mmol/L (ref 3.5–5.1)
Sodium: 138 mmol/L (ref 135–145)

## 2021-04-03 LAB — TYPE AND SCREEN
ABO/RH(D): O POS
Antibody Screen: NEGATIVE

## 2021-04-03 LAB — HEMOGLOBIN AND HEMATOCRIT, BLOOD
HCT: 43.1 % (ref 39.0–52.0)
Hemoglobin: 14.3 g/dL (ref 13.0–17.0)

## 2021-04-03 LAB — ABO/RH: ABO/RH(D): O POS

## 2021-04-03 SURGERY — NEPHRECTOMY, RADICAL, ROBOT-ASSISTED, LAPAROSCOPIC, ADULT
Anesthesia: General | Laterality: Left

## 2021-04-03 MED ORDER — HYDROMORPHONE HCL 1 MG/ML IJ SOLN
INTRAMUSCULAR | Status: DC | PRN
  Administered 2021-04-03 (×2): 1 mg via INTRAVENOUS

## 2021-04-03 MED ORDER — KETOROLAC TROMETHAMINE 30 MG/ML IJ SOLN
INTRAMUSCULAR | Status: DC | PRN
  Administered 2021-04-03: 30 mg via INTRAVENOUS

## 2021-04-03 MED ORDER — LACTATED RINGERS IV SOLN: INTRAVENOUS | Status: DC

## 2021-04-03 MED ORDER — MIDAZOLAM HCL 5 MG/5ML IJ SOLN
INTRAMUSCULAR | Status: DC | PRN
  Administered 2021-04-03: 2 mg via INTRAVENOUS

## 2021-04-03 MED ORDER — CEFAZOLIN SODIUM-DEXTROSE 2-4 GM/100ML-% IV SOLN
2.0000 g | Freq: Once | INTRAVENOUS | Status: AC
  Administered 2021-04-03: 2 g via INTRAVENOUS
  Filled 2021-04-03: qty 100

## 2021-04-03 MED ORDER — BUPIVACAINE LIPOSOME 1.3 % IJ SUSP
20.0000 mL | Freq: Once | INTRAMUSCULAR | Status: AC
  Administered 2021-04-03: 20 mL
  Filled 2021-04-03 (×2): qty 20

## 2021-04-03 MED ORDER — KETOROLAC TROMETHAMINE 30 MG/ML IJ SOLN
INTRAMUSCULAR | Status: AC
  Filled 2021-04-03: qty 1

## 2021-04-03 MED ORDER — OXYCODONE HCL 5 MG/5ML PO SOLN: 5.0000 mg | Freq: Once | ORAL | Status: DC | PRN

## 2021-04-03 MED ORDER — ROCURONIUM BROMIDE 10 MG/ML (PF) SYRINGE
PREFILLED_SYRINGE | INTRAVENOUS | Status: DC | PRN
  Administered 2021-04-03: 70 mg via INTRAVENOUS
  Administered 2021-04-03: 20 mg via INTRAVENOUS
  Administered 2021-04-03: 30 mg via INTRAVENOUS

## 2021-04-03 MED ORDER — DEXTROSE-NACL 5-0.45 % IV SOLN: INTRAVENOUS | Status: DC

## 2021-04-03 MED ORDER — DEXAMETHASONE SODIUM PHOSPHATE 10 MG/ML IJ SOLN
INTRAMUSCULAR | Status: DC | PRN
  Administered 2021-04-03: 10 mg via INTRAVENOUS

## 2021-04-03 MED ORDER — HYDROMORPHONE HCL 2 MG/ML IJ SOLN
INTRAMUSCULAR | Status: AC
  Filled 2021-04-03: qty 1

## 2021-04-03 MED ORDER — PROMETHAZINE HCL 25 MG/ML IJ SOLN: 6.2500 mg | INTRAMUSCULAR | Status: DC | PRN

## 2021-04-03 MED ORDER — ROCURONIUM BROMIDE 10 MG/ML (PF) SYRINGE
PREFILLED_SYRINGE | INTRAVENOUS | Status: AC
  Filled 2021-04-03: qty 10

## 2021-04-03 MED ORDER — HYDROMORPHONE HCL 1 MG/ML IJ SOLN
INTRAMUSCULAR | Status: AC
  Filled 2021-04-03: qty 1

## 2021-04-03 MED ORDER — MORPHINE SULFATE (PF) 4 MG/ML IV SOLN
2.0000 mg | INTRAVENOUS | Status: AC | PRN
  Administered 2021-04-04: 2 mg via INTRAVENOUS
  Filled 2021-04-03: qty 1

## 2021-04-03 MED ORDER — ALPRAZOLAM 1 MG PO TABS
1.0000 mg | ORAL_TABLET | Freq: Every day | ORAL | Status: AC | PRN
  Administered 2021-04-03: 1 mg via ORAL
  Filled 2021-04-03: qty 1

## 2021-04-03 MED ORDER — FENTANYL CITRATE (PF) 100 MCG/2ML IJ SOLN
INTRAMUSCULAR | Status: DC | PRN
  Administered 2021-04-03: 50 ug via INTRAVENOUS
  Administered 2021-04-03: 100 ug via INTRAVENOUS
  Administered 2021-04-03 (×2): 50 ug via INTRAVENOUS

## 2021-04-03 MED ORDER — ORAL CARE MOUTH RINSE: 15.0000 mL | Freq: Once | OROMUCOSAL | Status: AC

## 2021-04-03 MED ORDER — HYDROMORPHONE HCL 1 MG/ML IJ SOLN
0.2500 mg | INTRAMUSCULAR | Status: DC | PRN
Start: 2021-04-03 — End: 2021-04-03
  Administered 2021-04-03 (×2): 0.25 mg via INTRAVENOUS
  Administered 2021-04-03: 0.5 mg via INTRAVENOUS

## 2021-04-03 MED ORDER — CHLORHEXIDINE GLUCONATE 0.12 % MT SOLN
15.0000 mL | Freq: Once | OROMUCOSAL | Status: AC
  Administered 2021-04-03: 15 mL via OROMUCOSAL

## 2021-04-03 MED ORDER — ACETAMINOPHEN 10 MG/ML IV SOLN
1000.0000 mg | Freq: Four times a day (QID) | INTRAVENOUS | Status: AC
  Administered 2021-04-03 – 2021-04-04 (×4): 1000 mg via INTRAVENOUS
  Filled 2021-04-03 (×4): qty 100

## 2021-04-03 MED ORDER — CEFAZOLIN SODIUM-DEXTROSE 1-4 GM/50ML-% IV SOLN
1.0000 g | Freq: Three times a day (TID) | INTRAVENOUS | Status: AC
  Administered 2021-04-03 – 2021-04-04 (×2): 1 g via INTRAVENOUS
  Filled 2021-04-03 (×2): qty 50

## 2021-04-03 MED ORDER — LORAZEPAM 2 MG/ML IJ SOLN: 1.0000 mg | Freq: Once | INTRAMUSCULAR | Status: DC | PRN

## 2021-04-03 MED ORDER — MIDAZOLAM HCL 2 MG/2ML IJ SOLN
INTRAMUSCULAR | Status: AC
  Filled 2021-04-03: qty 2

## 2021-04-03 MED ORDER — ONDANSETRON HCL 4 MG/2ML IJ SOLN
INTRAMUSCULAR | Status: AC
  Filled 2021-04-03: qty 2

## 2021-04-03 MED ORDER — KETOROLAC TROMETHAMINE 15 MG/ML IJ SOLN
15.0000 mg | Freq: Four times a day (QID) | INTRAMUSCULAR | Status: DC
  Administered 2021-04-03 – 2021-04-04 (×4): 15 mg via INTRAVENOUS
  Filled 2021-04-03 (×4): qty 1

## 2021-04-03 MED ORDER — PROPOFOL 10 MG/ML IV BOLUS
INTRAVENOUS | Status: AC
  Filled 2021-04-03: qty 20

## 2021-04-03 MED ORDER — ONDANSETRON HCL 4 MG/2ML IJ SOLN
INTRAMUSCULAR | Status: DC | PRN
  Administered 2021-04-03: 4 mg via INTRAVENOUS

## 2021-04-03 MED ORDER — OXYCODONE HCL 5 MG PO TABS: 5.0000 mg | ORAL_TABLET | Freq: Once | ORAL | Status: DC | PRN

## 2021-04-03 MED ORDER — SODIUM CHLORIDE (PF) 0.9 % IJ SOLN
INTRAMUSCULAR | Status: DC | PRN
  Administered 2021-04-03: 20 mL

## 2021-04-03 MED ORDER — KETAMINE HCL 10 MG/ML IJ SOLN
INTRAMUSCULAR | Status: DC | PRN
  Administered 2021-04-03: 30 mg via INTRAVENOUS

## 2021-04-03 MED ORDER — DEXAMETHASONE SODIUM PHOSPHATE 10 MG/ML IJ SOLN
INTRAMUSCULAR | Status: AC
  Filled 2021-04-03: qty 1

## 2021-04-03 MED ORDER — SUGAMMADEX SODIUM 200 MG/2ML IV SOLN
INTRAVENOUS | Status: DC | PRN
  Administered 2021-04-03: 200 mg via INTRAVENOUS

## 2021-04-03 MED ORDER — PROPOFOL 10 MG/ML IV BOLUS
INTRAVENOUS | Status: DC | PRN
  Administered 2021-04-03: 160 mg via INTRAVENOUS

## 2021-04-03 MED ORDER — ONDANSETRON HCL 4 MG/2ML IJ SOLN
4.0000 mg | INTRAMUSCULAR | Status: DC | PRN
  Administered 2021-04-03: 4 mg via INTRAVENOUS
  Filled 2021-04-03: qty 2

## 2021-04-03 MED ORDER — LIDOCAINE 2% (20 MG/ML) 5 ML SYRINGE
INTRAMUSCULAR | Status: AC
  Filled 2021-04-03: qty 10

## 2021-04-03 MED ORDER — SODIUM CHLORIDE (PF) 0.9 % IJ SOLN
INTRAMUSCULAR | Status: AC
  Filled 2021-04-03: qty 20

## 2021-04-03 MED ORDER — FENTANYL CITRATE (PF) 250 MCG/5ML IJ SOLN
INTRAMUSCULAR | Status: AC
  Filled 2021-04-03: qty 5

## 2021-04-03 MED ORDER — TRAMADOL HCL 50 MG PO TABS
50.0000 mg | ORAL_TABLET | Freq: Four times a day (QID) | ORAL | Status: DC | PRN
  Administered 2021-04-03 – 2021-04-04 (×2): 50 mg via ORAL
  Filled 2021-04-03 (×2): qty 1

## 2021-04-03 MED ORDER — LACTATED RINGERS IR SOLN
Status: DC | PRN
  Administered 2021-04-03: 1000 mL

## 2021-04-03 MED ORDER — LIDOCAINE HCL 2 % IJ SOLN
INTRAMUSCULAR | Status: AC
  Filled 2021-04-03: qty 20

## 2021-04-03 MED ORDER — LACTATED RINGERS IV SOLN: INTRAVENOUS | Status: DC | PRN

## 2021-04-03 MED ORDER — ACETAMINOPHEN 500 MG PO TABS
1000.0000 mg | ORAL_TABLET | Freq: Once | ORAL | Status: AC
  Administered 2021-04-03: 1000 mg via ORAL
  Filled 2021-04-03: qty 2

## 2021-04-03 MED ORDER — STERILE WATER FOR IRRIGATION IR SOLN
Status: DC | PRN
  Administered 2021-04-03: 1000 mL

## 2021-04-03 MED ORDER — LIDOCAINE 20MG/ML (2%) 15 ML SYRINGE OPTIME
INTRAMUSCULAR | Status: DC | PRN
  Administered 2021-04-03: 1.5 mg/kg/h via INTRAVENOUS

## 2021-04-03 SURGICAL SUPPLY — 59 items
BAG COUNTER SPONGE SURGICOUNT (BAG) IMPLANT
BAG LAPAROSCOPIC 12 15 PORT 16 (BASKET) ×1 IMPLANT
BAG RETRIEVAL 12/15 (BASKET) ×2
BAG URINE DRAIN 2000ML AR STRL (UROLOGICAL SUPPLIES) IMPLANT
BAG URO CATCHER STRL LF (MISCELLANEOUS) IMPLANT
CHLORAPREP W/TINT 26 (MISCELLANEOUS) ×2 IMPLANT
CLIP VESOLOCK LG 6/CT PURPLE (CLIP) ×2 IMPLANT
CLIP VESOLOCK MED LG 6/CT (CLIP) ×2 IMPLANT
CLIP VESOLOCK XL 6/CT (CLIP) ×2 IMPLANT
COVER SURGICAL LIGHT HANDLE (MISCELLANEOUS) ×2 IMPLANT
COVER TIP SHEARS 8 DVNC (MISCELLANEOUS) ×1 IMPLANT
COVER TIP SHEARS 8MM DA VINCI (MISCELLANEOUS) ×1
CUTTER ECHEON FLEX ENDO 45 340 (ENDOMECHANICALS) ×2 IMPLANT
DERMABOND ADVANCED (GAUZE/BANDAGES/DRESSINGS) ×1
DERMABOND ADVANCED .7 DNX12 (GAUZE/BANDAGES/DRESSINGS) ×1 IMPLANT
DRAPE ARM DVNC X/XI (DISPOSABLE) ×4 IMPLANT
DRAPE COLUMN DVNC XI (DISPOSABLE) ×1 IMPLANT
DRAPE DA VINCI XI ARM (DISPOSABLE) ×4
DRAPE DA VINCI XI COLUMN (DISPOSABLE) ×1
DRAPE FOOT SWITCH (DRAPES) IMPLANT
DRAPE INCISE IOBAN 66X45 STRL (DRAPES) ×2 IMPLANT
DRAPE SHEET LG 3/4 BI-LAMINATE (DRAPES) ×2 IMPLANT
ELECT PENCIL ROCKER SW 15FT (MISCELLANEOUS) ×2 IMPLANT
ELECT REM PT RETURN 15FT ADLT (MISCELLANEOUS) ×2 IMPLANT
GLOVE SURG ENC MOIS LTX SZ6.5 (GLOVE) IMPLANT
GLOVE SURG ENC TEXT LTX SZ7.5 (GLOVE) ×4 IMPLANT
GOWN STRL REUS W/TWL LRG LVL3 (GOWN DISPOSABLE) ×8 IMPLANT
HOLDER FOLEY CATH W/STRAP (MISCELLANEOUS) ×2 IMPLANT
IRRIG SUCT STRYKERFLOW 2 WTIP (MISCELLANEOUS) ×2
IRRIGATION SUCT STRKRFLW 2 WTP (MISCELLANEOUS) ×1 IMPLANT
KIT BASIN OR (CUSTOM PROCEDURE TRAY) ×2 IMPLANT
KIT TURNOVER KIT A (KITS) ×2 IMPLANT
LOOP CUT BIPOLAR 24F LRG (ELECTROSURGICAL) IMPLANT
MANIFOLD NEPTUNE II (INSTRUMENTS) ×2 IMPLANT
MARKER SKIN DUAL TIP RULER LAB (MISCELLANEOUS) IMPLANT
NEEDLE INSUFFLATION 14GA 120MM (NEEDLE) ×2 IMPLANT
PACK CYSTO (CUSTOM PROCEDURE TRAY) IMPLANT
PENCIL SMOKE EVACUATOR (MISCELLANEOUS) IMPLANT
PROTECTOR NERVE ULNAR (MISCELLANEOUS) ×2 IMPLANT
SEAL CANN UNIV 5-8 DVNC XI (MISCELLANEOUS) ×4 IMPLANT
SEAL XI 5MM-8MM UNIVERSAL (MISCELLANEOUS) ×4
SET TUBE SMOKE EVAC HIGH FLOW (TUBING) ×2 IMPLANT
SOLUTION ELECTROLUBE (MISCELLANEOUS) ×2 IMPLANT
STAPLE RELOAD 45 WHT (STAPLE) ×3 IMPLANT
STAPLE RELOAD 45MM WHITE (STAPLE) ×3
SUT MNCRL AB 4-0 PS2 18 (SUTURE) ×4 IMPLANT
SUT PDS AB 0 CT1 36 (SUTURE) ×4 IMPLANT
SUT VICRYL 0 UR6 27IN ABS (SUTURE) ×4 IMPLANT
SYR TOOMEY IRRIG 70ML (MISCELLANEOUS)
SYRINGE TOOMEY IRRIG 70ML (MISCELLANEOUS) IMPLANT
TOWEL OR 17X26 10 PK STRL BLUE (TOWEL DISPOSABLE) ×2 IMPLANT
TOWEL OR NON WOVEN STRL DISP B (DISPOSABLE) ×2 IMPLANT
TRAY FOLEY MTR SLVR 16FR STAT (SET/KITS/TRAYS/PACK) ×2 IMPLANT
TRAY LAPAROSCOPIC (CUSTOM PROCEDURE TRAY) ×2 IMPLANT
TROCAR BLADELESS OPT 5 100 (ENDOMECHANICALS) IMPLANT
TROCAR XCEL 12X100 BLDLESS (ENDOMECHANICALS) ×2 IMPLANT
TUBING CONNECTING 10 (TUBING) IMPLANT
TUBING UROLOGY SET (TUBING) IMPLANT
WATER STERILE IRR 1000ML POUR (IV SOLUTION) ×2 IMPLANT

## 2021-04-03 NOTE — Op Note (Signed)
Operative Note  Preoperative diagnosis:  1.  Left renal mass  Postoperative diagnosis: 1.  Left renal mass   Procedure(s): 1.  Robot-assisted laparoscopic left radical nephrectomy  Surgeon: Ellison Hughs, MD  Assistants:  Bishop Limbo, MD PGY4  Anesthesia:  General  Complications:  None  EBL: 50 mL  Specimens: 1.  Left kidney  Drains/Catheters: 1.  Foley catheter  Intraoperative findings:   Hemostatic left renal hilum following staple ligation  Indication:  Steve Jimenez is a 40 y.o. male with an 11.5 cm solid and enhancing left renal mass with features concerning for renal cell carcinoma.  He has been consented for the above procedures, voices understanding and wishes to proceed.  Description of procedure:  After informed consent was obtained, the patient was brought to the operating room and general endotracheal anesthesia was administered.  The patient was then placed in the right lateral decubitus position and prepped and draped in usual sterile fashion.  A timeout was performed.  An 8 mm incision was then made lateral to the left rectus muscle at the level of the left 12th rib.  Abdominal access was obtained via a Veress needle.  The abdominal cavity was then insufflated up to 15 mmHg.  An 8 mm port was then introduced into the abdominal cavity.  Inspection of the port entry site by the robotic camera revealed no adjacent organ injury.  We then placed 3 additional 8 mm robotic ports to triangulate the left renal hilum.  A 12 mm assistant port was then placed between the carmera port and 3rd robotic arm.  The white line of Toldt along the descending colon was incised sharply and the colon, along with its mesocolonic fat, was reflected medially until the aorta was identified.  We then made a small window adjacent to the lower pole of the left kidney, identifying the left psoas muscle, left ureter and left gonadal vein.  The left ureter and gonadal vein were then  reflected anteriorly allowing [place] to then incised the perihilar attachments using electrocautery.  We encountered a small lumbar vein adjacent to the insertion of the left gonadal vein into the left renal vein.  This lumbar vein was ligated with hemo-lock clips in 2 places and incised sharply.  This provided [place] excellent exposure to the left renal hilum.    A 45 mm endovascular stapler was then used to ligate the left renal artery and then the left renal vein, achieving excellent hemostasis.  The remaining peri-renal attachments were then excised using a combination of blunt dissection and electrocautery.  The left adrenal gland was spared.  The endovascular stapler was then used to ligate the left gonadal vein and left ureter.  Once the kidney was freely mobile, it was placed in Endo Catch bag to be be retrieved at the conclusion of the case.  The robot was then de-docked.  A left lower quadrant Gibson incision was then made and the mass was removed within the Endo Catch bag.  The fascia within the midline assistant port was then closed using an interrupted 0 Vicryl suture.  The fascia of the internal and external oblique was then closed using a 0 PDS suture in a running fashion.  The subcutaneous tissue within the [name] incision was then closed using a running 0 Vicryl suture.  All skin incisions were then closed using 4-0 Monocryl and then dressed with Dermabond.  The patient tolerated the procedure well and was transferred to the postanesthesia in stable condition.  Plan: Monitor on the floor overnight

## 2021-04-03 NOTE — Progress Notes (Signed)
[facility]  Code Status: FULL Steve Jimenez is a 40 y.o. male patient admitted from PACU awake, alert - oriented X4 - no acute distress noted. VSS - Call light within reach, patient able to voice, and demonstrate understanding. 5 ports to abdomen clean and dry. No evidence of skin break down noted on exam.  ?  Will cont to eval and treat per MD orders.  Melonie Florida, RN  04/03/2021

## 2021-04-03 NOTE — Interval H&P Note (Signed)
History and Physical Interval Note:  04/03/2021 12:05 PM  Steve Jimenez  has presented today for surgery, with the diagnosis of LEFT RENAL MASS.  The various methods of treatment have been discussed with the patient and family. After consideration of risks, benefits and other options for treatment, the patient has consented to  Procedure(s): XI ROBOTIC ASSISTED LAPAROSCOPIC NEPHRECTOMY VERSUS NEPHROURETERECTOMY (Left) CYSTOSCOPY WITH URETEROSCOPY/ POSSIBLE RENAL MASS BIOPSY/ POSSIBLE URANSURETHRAL RESECTION OF LEFT URETERAL ORIFICE (Left) as a surgical intervention.  The patient's history has been reviewed, patient examined, no change in status, stable for surgery.  I have reviewed the patient's chart and labs.  Questions were answered to the patient's satisfaction.     Chigozie Basaldua Abu-Salha

## 2021-04-03 NOTE — Transfer of Care (Signed)
Immediate Anesthesia Transfer of Care Note  Patient: Steve Jimenez  Procedure(s) Performed: XI ROBOTIC ASSISTED LAPAROSCOPIC NEPHRECTOMY (Left)  Patient Location: PACU  Anesthesia Type:General  Level of Consciousness: awake, alert  and oriented  Airway & Oxygen Therapy: Patient Spontanous Breathing and Patient connected to face mask oxygen  Post-op Assessment: Report given to RN and Post -op Vital signs reviewed and stable  Post vital signs: Reviewed and stable  Last Vitals:  Vitals Value Taken Time  BP 145/96 04/03/21 1536  Temp    Pulse 60 04/03/21 1539  Resp 19 04/03/21 1539  SpO2 100 % 04/03/21 1539  Vitals shown include unvalidated device data.  Last Pain:  Vitals:   04/03/21 1020  TempSrc: Oral         Complications: No notable events documented.

## 2021-04-03 NOTE — Anesthesia Procedure Notes (Signed)
Procedure Name: Intubation Date/Time: 04/03/2021 12:30 PM Performed by: Trayson Stitely D, CRNA Pre-anesthesia Checklist: Patient identified, Emergency Drugs available, Suction available and Patient being monitored Patient Re-evaluated:Patient Re-evaluated prior to induction Oxygen Delivery Method: Circle system utilized Preoxygenation: Pre-oxygenation with 100% oxygen Induction Type: IV induction Ventilation: Mask ventilation without difficulty Laryngoscope Size: Mac and 4 Grade View: Grade II Tube type: Oral Tube size: 7.5 mm Number of attempts: 1 Airway Equipment and Method: Stylet Placement Confirmation: ETT inserted through vocal cords under direct vision, positive ETCO2 and breath sounds checked- equal and bilateral Secured at: 22 cm Tube secured with: Tape Dental Injury: Teeth and Oropharynx as per pre-operative assessment

## 2021-04-03 NOTE — H&P (Signed)
PRE-OP H&P  Office Visit Report     03/19/2021   --------------------------------------------------------------------------------   Steve Jimenez  MRN: [id number]  DOB: 1981-04-27, 40 year old male  PRIMARY CARE:    REFERRING:    PROVIDER:  Ellison Hughs, M.D.  LOCATION:  [facility] [phone]     --------------------------------------------------------------------------------   CC/HPI: CC: Left renal mass   The patient is a 40 year old male with a solid and enhancing 11.5 cm left renal mass with indeterminate features between renal cell carcinoma and urothelial cell carcinoma.   03/19/2021: Patient with the above-noted history. He presents today for a preoperative appointment and to discuss his surgery in more detail. He recently returned from a work trip in [place]. He denies interval flank pain, gross hematuria, neurologic changes or weight loss.       ALLERGIES: Aspirin TABS Bee stings    MEDICATIONS: Alprazolam 0.5 mg tablet     GU PSH: Vasectomy     NON-GU PSH: Appendectomy - 2013     GU PMH: Disorder of male genital organs, unspecified - 2018 Encounter for sterilization, Encounter for sterilization - 2014      PMH Notes:  1898-10-04 00:00:00 - Note: Normal Routine History And Physical Adult   NON-GU PMH: Personal history of other specified conditions, History of heartburn - 2014 Anxiety GERD Hypertension    FAMILY HISTORY: 2 daughters - Daughter 2 sons - Son Family Health Status - Father alive at age 50 - 37 In Family Family Health Status - Mother's Age - Runs In Family Family Health Status Number - Runs In Family   SOCIAL HISTORY: Marital Status: Married Preferred Language: English Current Smoking Status: Patient does not smoke anymore. Has not smoked since 01/03/2007. Smoked for 6 years.   Tobacco Use Assessment Completed: Used Tobacco in last 30 days? Does drink.  Drinks 2 caffeinated drinks per day. 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     Notes: Tobacco use, Occupation:, Marital History - Currently Married, Caffeine Use, Alcohol Use   VITAL SIGNS:      03/19/2021 03:50 PM  Weight 220 lb / 99.79 kg  Height 6 in / 15.24 cm  BP 131/77 mmHg  Pulse 55 /min  Temperature 97.5 F / 36.3 C  BMI 4,296.1 kg/m   MULTI-SYSTEM PHYSICAL EXAMINATION:    Constitutional: Well-nourished. No physical deformities. Normally developed. Good grooming.  Respiratory: No labored breathing, no use of accessory muscles.   Cardiovascular: Normal temperature, normal extremity pulses, no swelling, no varicosities.  Neurologic / Psychiatric: Oriented to time, oriented to place, oriented to person. No depression, no anxiety, no agitation.  Gastrointestinal: Obese abdomen. No mass, no tenderness, no rigidity.   Musculoskeletal: Normal gait and station of head and neck.     Complexity of Data:  Lab Test Review:   BUN/Creatinine  Records Review:   Previous Patient Records  X-Ray Review: C.T. Abdomen/Pelvis: Reviewed Films. Reviewed Report. Discussed With Patient.     PROCEDURES:          Urinalysis w/Scope Dipstick Dipstick Cont'd Micro  Color: Yellow Bilirubin: Neg mg/dL WBC/hpf: 6 - 10/hpf  Appearance: Clear Ketones: Neg mg/dL RBC/hpf: 0 - 2/hpf  Specific Gravity: 1.020 Blood: Neg ery/uL Bacteria: Rare (0-9/hpf)  pH: <=5.0 Protein: Neg mg/dL Cystals: NS (Not Seen)  Glucose: Neg mg/dL Urobilinogen: 0.2 mg/dL Casts: NS (Not Seen)    Nitrites: Neg Trichomonas: Not Present    Leukocyte Esterase: Trace leu/uL Mucous: Not Present      Epithelial Cells:  NS (Not Seen)      Yeast: NS (Not Seen)      Sperm: Not Present    ASSESSMENT:      ICD-10 Details  1 GU:   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 Contrast          Schedule Return Visit/Planned Activity: ASAP - Schedule Surgery             Note: Ok to cancel OV 03/27/21           Document Letter(s):  Created for Patient: Clinical Summary         Notes:   -I reviewed imaging results and films with the patient personally. We discussed that the mass in question has features concerning for malignancy. I explained the natural history of presumed renal cell carcinoma vs urothelial cell carcinoma. I reviewed the AUA guidelines for evaluation and treatment of renal masses. radical nephrectomy with and without nephroureterectomy was discussed. The risks of laparoscopic LEFT radical nephrectomy vs nephroureterectomy were discussed in detail including but not limited to: negative pathology, open conversion, infection of the skin/abdominal cavity, VTE, MI/CVA, lymphatic leak, injury to adjacent solid/hollow viscus organs, bleeding requiring a blood transfusion, catastrophic bleeding, hernia formation and other imponderables. The patient voices understanding and wishes to proceed.

## 2021-04-03 NOTE — Anesthesia Preprocedure Evaluation (Addendum)
Anesthesia Evaluation  Patient identified by MRN, date of birth, ID band Patient awake    Reviewed: Allergy & Precautions, NPO status , Patient's Chart, lab work & pertinent test results  Airway Mallampati: III  TM Distance: >3 FB Neck ROM: Full    Dental no notable dental hx. (+)    Pulmonary neg pulmonary ROS, former smoker,    Pulmonary exam normal breath sounds clear to auscultation       Cardiovascular negative cardio ROS Normal cardiovascular exam Rhythm:Regular Rate:Normal  EKG: NSR   Neuro/Psych PSYCHIATRIC DISORDERS Anxiety  Neuromuscular disease (sciatica) negative neurological ROS  negative psych ROS   GI/Hepatic negative GI ROS, Neg liver ROS,   Endo/Other  negative endocrine ROSObesity bmi 36  Renal/GU negative Renal ROS  negative genitourinary   Musculoskeletal negative musculoskeletal ROS (+)   Abdominal   Peds negative pediatric ROS (+)  Hematology negative hematology ROS (+)   Anesthesia Other Findings   Reproductive/Obstetrics negative OB ROS                           Anesthesia Physical Anesthesia Plan  ASA: 2  Anesthesia Plan: General   Post-op Pain Management:    Induction: Intravenous  PONV Risk Score and Plan: 2 and Treatment may vary due to age or medical condition  Airway Management Planned: Oral ETT and Video Laryngoscope Planned  Additional Equipment: None  Intra-op Plan:   Post-operative Plan: Extubation in OR  Informed Consent: I have reviewed the patients History and Physical, chart, labs and discussed the procedure including the risks, benefits and alternatives for the proposed anesthesia with the patient or authorized representative who has indicated his/her understanding and acceptance.       Plan Discussed with: CRNA and Anesthesiologist  Anesthesia Plan Comments: (GETA. 2 PIV. Glidescope available given front two teeth are caps. Norton Blizzard, MD  )      Anesthesia Quick Evaluation

## 2021-04-03 NOTE — Anesthesia Postprocedure Evaluation (Signed)
Anesthesia Post Note  Patient: Steve Jimenez  Procedure(s) Performed: XI ROBOTIC ASSISTED LAPAROSCOPIC NEPHRECTOMY (Left)     Patient location during evaluation: PACU Anesthesia Type: General Level of consciousness: awake and alert Pain management: pain level controlled Vital Signs Assessment: post-procedure vital signs reviewed and stable Respiratory status: spontaneous breathing, nonlabored ventilation and respiratory function stable Cardiovascular status: blood pressure returned to baseline and stable Postop Assessment: no apparent nausea or vomiting Anesthetic complications: no   No notable events documented.  Last Vitals:  Vitals:   04/03/21 1630 04/03/21 1655  BP: (!) 145/99 (!) 142/90  Pulse: 61 (!) 57  Resp: 18 18  Temp: 36.6 C 36.9 C  SpO2: 98% 98%    Last Pain:  Vitals:   04/03/21 1730  TempSrc:   PainSc: 5                  Lidia Collum

## 2021-04-04 ENCOUNTER — Encounter (HOSPITAL_COMMUNITY): Payer: Self-pay | Admitting: Urology

## 2021-04-04 ENCOUNTER — Inpatient Hospital Stay (HOSPITAL_COMMUNITY): Payer: BC Managed Care – PPO

## 2021-04-04 DIAGNOSIS — Z87891 Personal history of nicotine dependence: Secondary | ICD-10-CM | POA: Diagnosis not present

## 2021-04-04 DIAGNOSIS — I10 Essential (primary) hypertension: Secondary | ICD-10-CM | POA: Diagnosis present

## 2021-04-04 DIAGNOSIS — K219 Gastro-esophageal reflux disease without esophagitis: Secondary | ICD-10-CM | POA: Diagnosis present

## 2021-04-04 DIAGNOSIS — C642 Malignant neoplasm of left kidney, except renal pelvis: Secondary | ICD-10-CM | POA: Diagnosis present

## 2021-04-04 DIAGNOSIS — E669 Obesity, unspecified: Secondary | ICD-10-CM | POA: Diagnosis present

## 2021-04-04 DIAGNOSIS — D49512 Neoplasm of unspecified behavior of left kidney: Secondary | ICD-10-CM | POA: Diagnosis present

## 2021-04-04 DIAGNOSIS — F419 Anxiety disorder, unspecified: Secondary | ICD-10-CM | POA: Diagnosis present

## 2021-04-04 DIAGNOSIS — Z20822 Contact with and (suspected) exposure to covid-19: Secondary | ICD-10-CM | POA: Diagnosis present

## 2021-04-04 DIAGNOSIS — Z6836 Body mass index (BMI) 36.0-36.9, adult: Secondary | ICD-10-CM | POA: Diagnosis not present

## 2021-04-04 LAB — CBC
HCT: 41.9 % (ref 39.0–52.0)
Hemoglobin: 14.2 g/dL (ref 13.0–17.0)
MCH: 30.3 pg (ref 26.0–34.0)
MCHC: 33.9 g/dL (ref 30.0–36.0)
MCV: 89.5 fL (ref 80.0–100.0)
Platelets: 334 10*3/uL (ref 150–400)
RBC: 4.68 MIL/uL (ref 4.22–5.81)
RDW: 11.7 % (ref 11.5–15.5)
WBC: 11.6 10*3/uL — ABNORMAL HIGH (ref 4.0–10.5)
nRBC: 0 % (ref 0.0–0.2)

## 2021-04-04 LAB — BASIC METABOLIC PANEL
Anion gap: 8 (ref 5–15)
BUN: 13 mg/dL (ref 6–20)
CO2: 24 mmol/L (ref 22–32)
Calcium: 9 mg/dL (ref 8.9–10.3)
Chloride: 101 mmol/L (ref 98–111)
Creatinine, Ser: 1.1 mg/dL (ref 0.61–1.24)
GFR, Estimated: >60 mL/min (ref >60)
Glucose, Bld: 154 mg/dL — ABNORMAL HIGH (ref 70–99)
Potassium: 4.1 mmol/L (ref 3.5–5.1)
Sodium: 133 mmol/L — ABNORMAL LOW (ref 135–145)

## 2021-04-04 LAB — TROPONIN I (HIGH SENSITIVITY): Troponin I (High Sensitivity): 2 ng/L (ref <18)

## 2021-04-04 MED ORDER — HYDROCODONE-ACETAMINOPHEN 5-325 MG PO TABS: 1.0000 | ORAL_TABLET | ORAL | 0 refills | Status: DC | PRN

## 2021-04-04 MED ORDER — CHLORHEXIDINE GLUCONATE CLOTH 2 % EX PADS: 6.0000 | MEDICATED_PAD | Freq: Every day | CUTANEOUS | Status: DC

## 2021-04-04 NOTE — Progress Notes (Signed)
Patient was complaining of some left-sided chest discomfort.  I was notified by the nurse.  Chest x-ray was performed that showed no acute cardiopulmonary abnormalities.  There was increased caliber of the small bowel loops concerning for postoperative ileus which is to be expected after his surgery.  He also had some pneumoperitoneum consistent with recent laparoscopic surgery.  Upon further questioning, the pain is right under his clavicle.  His troponin was normal.  He denies any chest pain or shortness of breath.  His oxygen saturation is 100% on room air.  Low suspicion for cardiopulmonary process.  Sounds more related to positioning.  Upon further questioning, this has been present and was initially present under his clavicles bilaterally but the right side has improved.  This has been present since surgery and I think it is likely related to positioning.  It is not severe in nature.  Okay to discharge home after voiding.

## 2021-04-04 NOTE — Discharge Instructions (Addendum)

## 2021-04-04 NOTE — Progress Notes (Signed)
Pt report having chest and shoulder discomfort. With radiating pain. Vital signs stable. Call in to MD. Awaiting call back. MD call back [phone]. Orders recd and initiated.

## 2021-04-04 NOTE — Discharge Summary (Signed)
Physician Discharge Summary  Patient ID: Steve Jimenez MRN: [id number] DOB/AGE: 41-Aug-1982 41 y.o.  Admit date: 04/03/2021 Discharge date: 04/04/2021  Admission Diagnoses:  Discharge Diagnoses:  Active Problems:   Renal mass   Discharged Condition: good  Hospital Course: 40 year old male underwent left robotic assisted laparoscopic nephrectomy on 04/03/2021.  He was doing very well the following day.  He was tolerating a diet and ambulating well.  Foley catheter was removed and he was discharged in stable condition.  Consults: None  Significant Diagnostic Studies: None  Treatments: surgery: As above  Discharge Exam: Blood pressure 115/79, pulse (!) 55, temperature 98 F (36.7 C), temperature source Oral, resp. rate 16, SpO2 99 %. General appearance: alert no acute distress Adequate perfusion of extremities Abdomen soft, appropriately tender, incisions clean dry and intact Foley catheter draining clear yellow urine  Disposition: Discharge disposition: 01-Home or Self Care       Discharge Instructions     No wound care   Complete by: As directed       Allergies as of 04/04/2021       Reactions   Bee Venom Anaphylaxis   Aspirin    Other reaction(s): as a child        Medication List     TAKE these medications    HYDROcodone-acetaminophen 5-325 MG tablet Commonly known as: Norco Take 1 tablet by mouth every 4 (four) hours as needed for moderate pain.   ibuprofen 200 MG tablet Commonly known as: ADVIL Take 400-600 mg by mouth every 6 (six) hours as needed for moderate pain.       ASK your doctor about these medications    ALPRAZolam 0.5 MG tablet Commonly known as: XANAX Take 1 tablet (0.5 mg total) by mouth daily as needed.   atorvastatin 20 MG tablet Commonly known as: LIPITOR TAKE 1 TABLET BY MOUTH EVERY DAY   Coenzyme Q10 200 MG capsule Take 1 capsule (200 mg total) by mouth daily.   hydrOXYzine 25 MG tablet Commonly known as:  ATARAX/VISTARIL Take 1 tablet (25 mg total) by mouth every 8 (eight) hours as needed for itching.         Signed: Marton Redwood, III 04/04/2021, 12:28 PM

## 2021-04-04 NOTE — Progress Notes (Signed)
Pt d/c home with wife denies any further chest discomfort. Verbalized understanding of instructions give

## 2021-04-04 NOTE — Progress Notes (Signed)
MD at bedside. Discussed plan of care : Xray results, EKG, and labs. Per md once pt voids he may be d/c home with wife. Pt reports that chest discomfort is better at this time.

## 2021-04-07 LAB — SURGICAL PATHOLOGY

## 2021-04-16 DIAGNOSIS — D49512 Neoplasm of unspecified behavior of left kidney: Secondary | ICD-10-CM | POA: Diagnosis not present

## 2021-04-24 ENCOUNTER — Telehealth: Payer: Self-pay | Admitting: Oncology

## 2021-04-24 NOTE — Telephone Encounter (Signed)
Scheduled appt per referral. Pt aware.  

## 2021-05-14 ENCOUNTER — Inpatient Hospital Stay: Payer: BC Managed Care – PPO | Attending: Oncology | Admitting: Oncology

## 2021-05-14 ENCOUNTER — Other Ambulatory Visit: Payer: Self-pay

## 2021-05-14 VITALS — BP 131/89 | HR 60 | Temp 97.9°F | Resp 17 | Ht 67.0 in | Wt 220.0 lb

## 2021-05-14 DIAGNOSIS — F419 Anxiety disorder, unspecified: Secondary | ICD-10-CM | POA: Insufficient documentation

## 2021-05-14 DIAGNOSIS — Z905 Acquired absence of kidney: Secondary | ICD-10-CM | POA: Insufficient documentation

## 2021-05-14 DIAGNOSIS — C642 Malignant neoplasm of left kidney, except renal pelvis: Secondary | ICD-10-CM

## 2021-05-14 DIAGNOSIS — Z87891 Personal history of nicotine dependence: Secondary | ICD-10-CM | POA: Insufficient documentation

## 2021-05-14 DIAGNOSIS — I209 Angina pectoris, unspecified: Secondary | ICD-10-CM | POA: Insufficient documentation

## 2021-05-14 DIAGNOSIS — Z79899 Other long term (current) drug therapy: Secondary | ICD-10-CM | POA: Insufficient documentation

## 2021-05-14 NOTE — Progress Notes (Signed)
Reason for the request:    Kidney cancer  HPI: I was asked by Dr. Lovena Neighbours to evaluate Steve Jimenez for the evaluation of kidney cancer.  He is a 40 year old who presented with hematuria intermittently for the last year.  He subsequently was evaluated by CT scan on January 20, 2021 showed left kidney mass measuring 11.5 x 7.2 x 6.1 cm without any evidence of lymphadenopathy or metastatic disease.  He was evaluated by Dr. Lovena Neighbours and underwent robot-assisted laparoscopic left radical nephrectomy completed on April 03, 2021.  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  Margins are negative at that time with the final pathological staging T2BNX.  Clinically, he reports feeling well without any major complications.  He denies any nausea or residual pain after surgery.  He has resumed to all work-related duties without any decline.  He does not report any headaches, blurry vision, syncope or seizures. Does not report any fevers, chills or sweats.  Does not report any cough, wheezing or hemoptysis.  Does not report any chest pain, palpitation, orthopnea or leg edema.  Does not report any nausea, vomiting or abdominal pain.  Does not report any constipation or diarrhea.  Does not report any skeletal complaints.    Does not report frequency, urgency or hematuria.  Does not report any skin rashes or lesions. Does not report any heat or cold intolerance.  Does not report any lymphadenopathy or petechiae.  Does not report any anxiety or depression.  Remaining review of systems is negative.     Past Medical History:  Diagnosis Date   Anginal pain ([place]) 10/2019   due to anxitey   Anxiety   :   Past Surgical History:  Procedure Laterality Date   APPENDECTOMY     age 50   ROBOT ASSISTED LAPAROSCOPIC NEPHRECTOMY Left 04/03/2021   Procedure: XI ROBOTIC ASSISTED LAPAROSCOPIC NEPHRECTOMY;  Surgeon: Ceasar Mons, MD;  Location: [facility];  Service:  Urology;  Laterality: Left;  :   Current Outpatient Medications:    ALPRAZolam (XANAX) 0.5 MG tablet, Take 1 tablet (0.5 mg total) by mouth daily as needed. (Patient taking differently: Take 0.5 mg by mouth daily as needed for anxiety.), Disp: 30 tablet, Rfl: 2   atorvastatin (LIPITOR) 20 MG tablet, TAKE 1 TABLET BY MOUTH EVERY DAY (Patient not taking: Reported on 03/23/2021), Disp: 90 tablet, Rfl: 3   Coenzyme Q10 200 MG capsule, Take 1 capsule (200 mg total) by mouth daily. (Patient not taking: Reported on 03/23/2021), Disp: 90 capsule, Rfl: 3   HYDROcodone-acetaminophen (NORCO) 5-325 MG tablet, Take 1 tablet by mouth every 4 (four) hours as needed for moderate pain., Disp: 12 tablet, Rfl: 0   hydrOXYzine (ATARAX/VISTARIL) 25 MG tablet, Take 1 tablet (25 mg total) by mouth every 8 (eight) hours as needed for itching. (Patient not taking: Reported on 03/23/2021), Disp: 12 tablet, Rfl: 0   ibuprofen (ADVIL) 200 MG tablet, Take 400-600 mg by mouth every 6 (six) hours as needed for moderate pain., Disp: , Rfl: :   Allergies  Allergen Reactions   Bee Venom Anaphylaxis   Aspirin     Other reaction(s): as a child  :   Family History  Problem Relation Age of Onset   Hypertension Mother    Healthy Father   :   Social History   Socioeconomic History   Marital status: Married    Spouse name: Not on file   Number of children: Not on  file   Years of education: Not on file   Highest education level: Not on file  Occupational History   Not on file  Tobacco Use   Smoking status: Former    Years: 10.00    Types: Cigarettes    Quit date: 2004    Years since quitting: 18.6   Smokeless tobacco: Never  Vaping Use   Vaping Use: Never used  Substance and Sexual Activity   Alcohol use: Yes    Alcohol/week: 6.0 standard drinks    Types: 6 Standard drinks or equivalent per week    Comment: socially   Drug use: Never   Sexual activity: Not on file  Other Topics Concern   Not on file   Social History Narrative   Not on file   Social Determinants of Health   Financial Resource Strain: Not on file  Food Insecurity: Not on file  Transportation Needs: Not on file  Physical Activity: Not on file  Stress: Not on file  Social Connections: Not on file  Intimate Partner Violence: Not on file  :  Pertinent items are noted in HPI.  Exam: ECOG General appearance: alert and cooperative appeared without distress. Head: atraumatic without any abnormalities. Eyes: conjunctivae/corneas clear. PERRL.  Sclera anicteric. Throat: lips, mucosa, and tongue normal; without oral thrush or ulcers. Resp: clear to auscultation bilaterally without rhonchi, wheezes or dullness to percussion. Cardio: regular rate and rhythm, S1, S2 normal, no murmur, click, rub or gallop GI: soft, non-tender; bowel sounds normal; no masses,  no organomegaly Skin: Skin color, texture, turgor normal. No rashes or lesions Lymph nodes: Cervical, supraclavicular, and axillary nodes normal. Neurologic: Grossly normal without any motor, sensory or deep tendon reflexes. Musculoskeletal: No joint deformity or effusion.    Assessment and Plan:   40 year old with:  1.  Renal cell carcinoma diagnosed in July 2022.  He was found to have 11.9 tumor of the left kidney.  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  Imaging studies including CT scan on January 20, 2021 as well as chest x-ray obtained on June 29 did not show any evidence of metastatic disease.  The natural course of this disease and treatment options were discussed at this time.  He presents with high risk tumor associated with high-grade features as well as sarcomatoid differentiation putting him at increased risk of metastatic disease.  I recommended updating his staging scan with CT scan chest abdomen and pelvis in the near future to complete his  staging process.  Pending these results management options would include adjuvant therapy utilizing Pembrolizumab for 1 year.  Combination immunotherapy as well as combination oral targeted therapy with immunotherapy will be reserved if he has metastatic disease.  Complication associated with Pembrolizumab was discussed today in detail.  These would include nausea, fatigue, pruritus and autoimmune complications.  It could include pneumonitis, colitis and thyroid disease.  He also understands that more intensive therapy may be required if he has metastatic disease and that is what is critical to update his staging sooner rather than later.  2.  IV access: Peripheral veins will be in use regardless of his CT scan results and treatment choices.  3.  Antiemetics: Prescription for Compazine will be available to him.  4.  Autoimmune complications: We will continue to monitor symptoms of pneumonitis, colitis and thyroid disease while on therapy.  5.  Follow-up: We will be in the near  future to start immunotherapy.   60  minutes were dedicated to this visit. The time was spent on reviewing laboratory data, imaging studies, discussing treatment options, and answering questions regarding future plan.    A copy of this consult has been forwarded to the requesting physician.

## 2021-05-14 NOTE — Progress Notes (Signed)
START ON PATHWAY REGIMEN - Renal Cell     A cycle is every 21 days:     Pembrolizumab   **Always confirm dose/schedule in your pharmacy ordering system**  Patient Characteristics: Postoperative without Neoadjuvant Therapy, M0 (Pathologic Staging), Stage I, II, III, or Resected T4M0 Stage IV, High Risk Therapeutic Status: Postoperative without Neoadjuvant Therapy, M0 (Pathologic Staging) AJCC M Category: cM0 AJCC 8 Stage Grouping: II AJCC T Category: pT2b AJCC N Category: pN0 Risk Status: High Risk Intent of Therapy: Curative Intent, Discussed with Patient

## 2021-05-21 ENCOUNTER — Other Ambulatory Visit: Payer: Self-pay

## 2021-05-21 ENCOUNTER — Ambulatory Visit (HOSPITAL_COMMUNITY)
Admission: RE | Admit: 2021-05-21 | Discharge: 2021-05-21 | Disposition: A | Discharge status: Home / Self Care | Payer: BC Managed Care – PPO | Source: Ambulatory Visit | Attending: Oncology | Admitting: Oncology

## 2021-05-21 DIAGNOSIS — Z9889 Other specified postprocedural states: Secondary | ICD-10-CM | POA: Diagnosis not present

## 2021-05-21 DIAGNOSIS — C642 Malignant neoplasm of left kidney, except renal pelvis: Secondary | ICD-10-CM

## 2021-05-21 DIAGNOSIS — K760 Fatty (change of) liver, not elsewhere classified: Secondary | ICD-10-CM | POA: Diagnosis not present

## 2021-05-21 DIAGNOSIS — R911 Solitary pulmonary nodule: Secondary | ICD-10-CM | POA: Diagnosis not present

## 2021-05-21 LAB — POCT I-STAT CREATININE: Creatinine, Ser: 1.1 mg/dL (ref 0.61–1.24)

## 2021-05-21 MED ORDER — IOHEXOL 350 MG/ML SOLN
100.0000 mL | Freq: Once | INTRAVENOUS | Status: AC | PRN
  Administered 2021-05-21: 100 mL via INTRAVENOUS

## 2021-05-21 NOTE — Progress Notes (Addendum)
Pharmacist Chemotherapy Monitoring - Initial Assessment    Anticipated start date: 05/28/21   The following has been reviewed per standard work regarding the patient's treatment regimen: The patient's diagnosis, treatment plan and drug doses, and organ/hematologic function Lab orders and baseline tests specific to treatment regimen  The treatment plan start date, drug sequencing, and pre-medications Prior authorization status  Patient's documented medication list, including drug-drug interaction screen and prescriptions for anti-emetics and supportive care specific to the treatment regimen The drug concentrations, fluid compatibility, administration routes, and timing of the medications to be used The patient's access for treatment and lifetime cumulative dose history, if applicable  The patient's medication allergies and previous infusion related reactions, if applicable   Changes made to treatment plan:  N/A  Follow up needed:  Pending authorization for treatment  F/U CMET results   Acquanetta Belling, RPH, BCPS, BCOP 05/21/2021  12:42 PM

## 2021-05-22 ENCOUNTER — Inpatient Hospital Stay: Payer: BC Managed Care – PPO

## 2021-05-22 ENCOUNTER — Ambulatory Visit: Payer: Self-pay | Admitting: Adult Health

## 2021-05-22 ENCOUNTER — Other Ambulatory Visit: Payer: Self-pay

## 2021-05-22 ENCOUNTER — Encounter: Payer: Self-pay | Admitting: Oncology

## 2021-05-22 ENCOUNTER — Telehealth: Payer: Self-pay | Admitting: Oncology

## 2021-05-22 DIAGNOSIS — C642 Malignant neoplasm of left kidney, except renal pelvis: Secondary | ICD-10-CM

## 2021-05-22 MED ORDER — PROCHLORPERAZINE MALEATE 10 MG PO TABS: 10.0000 mg | ORAL_TABLET | Freq: Four times a day (QID) | ORAL | 0 refills | Status: DC | PRN

## 2021-05-22 NOTE — Telephone Encounter (Signed)
Rescheduled upcoming appointments per patient's request. Navigator informed me provider and nurse has already been made aware.

## 2021-05-22 NOTE — Progress Notes (Signed)
Met with patient at registration to introduce myself as Arboriculturist and to offer available resources.  Discussed one-time $1000 Radio broadcast assistant to assist with personal expenses while going through treatment.  Also, discussed available copay assistance if ded/OOP has not been met.  Gave my card if interested in applying and for any additional financial questions or concerns.

## 2021-05-22 NOTE — Telephone Encounter (Signed)
Scheduled appt per 8/19 sch msg. Pt aware.

## 2021-05-25 ENCOUNTER — Inpatient Hospital Stay (HOSPITAL_BASED_OUTPATIENT_CLINIC_OR_DEPARTMENT_OTHER): Payer: BC Managed Care – PPO | Admitting: Oncology

## 2021-05-25 DIAGNOSIS — C642 Malignant neoplasm of left kidney, except renal pelvis: Secondary | ICD-10-CM | POA: Diagnosis not present

## 2021-05-25 NOTE — Progress Notes (Signed)
Hematology and Oncology Follow Up for Telemedicine Visits  Arlan Obier [id number] 28-Dec-1980 40 y.o. 05/25/2021 10:36 AM [name], PA-CWharton, Loma Sousa, PA-C   I connected with Mr. Desilets on 05/25/21 at 11:00 AM EDT by telephone visit and verified that I am speaking with the correct person using two identifiers.   I discussed the limitations, risks, security and privacy concerns of performing an evaluation and management service by telemedicine and the availability of in-person appointments. I also discussed with the patient that there may be a patient responsible charge related to this service. The patient expressed understanding and agreed to proceed.  Other persons participating in the visit and their role in the encounter: None  Patient's location:  Home Provider's location:  office    Principle Diagnosis: 40 year old with kidney cancer diagnosed in July 2022.  He was found to have 11.9 cm left kidney tumor with clear-cell histology and sarcomatoid features.   Prior Therapy:  He is status post robot-assisted laparoscopic left radical nephrectomy completed on April 03, 2021.  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  Margins are negative at that time with the final pathological staging T2BNX.  Current therapy: Under evaluation for adjuvant therapy.  Interim History: Mr. Anastasia reports   does not report any headaches, blurry vision, syncope or seizures. Does not report any fevers, chills or sweats.  Does not    Medications: I have reviewed the patient's current medications.  Current Outpatient Medications  Medication Sig Dispense Refill   ALPRAZolam (XANAX) 0.5 MG tablet Take 1 tablet (0.5 mg total) by mouth daily as needed. (Patient taking differently: Take 0.5 mg by mouth daily as needed for anxiety.) 30 tablet 2   prochlorperazine (COMPAZINE) 10 MG tablet Take 1 tablet (10 mg total) by mouth every 6  (six) hours as needed for nausea or vomiting. 30 tablet 0   No current facility-administered medications for this visit.     Allergies:  Allergies  Allergen Reactions   Bee Venom Anaphylaxis   Aspirin     Other reaction(s): as a child       Lab Results: Lab Results  Component Value Date   WBC 11.6 (H) 04/04/2021   HGB 14.2 04/04/2021   HCT 41.9 04/04/2021   MCV 89.5 04/04/2021   PLT 334 04/04/2021     Chemistry      Component Value Date/Time   NA 133 (L) 04/04/2021 0515   K 4.1 04/04/2021 0515   CL 101 04/04/2021 0515   CO2 24 04/04/2021 0515   BUN 13 04/04/2021 0515   CREATININE 1.10 05/21/2021 1412      Component Value Date/Time   CALCIUM 9.0 04/04/2021 0515          Impression and Plan:  24 year old with:  1.  Kidney cancer diagnosed in July 2022.  He presented with left clear-cell renal cell carcinoma with sarcomatoid features and grade 4 histology.     The rationale for using adjuvant immunotherapy was discussed today in detail.  The study that led to adjuvant Pembrolizumab post nephrectomy was discussed and a in detail and literature will be provided to him based on his request.  We also discussed the complications associated with this therapy in detail.  At this time he is agreeable to proceed and also interesting to talking with other patients or witnessed similar experiences.  We will attempt to arrange that for him in the interim.      .  Follow-up:  We will be in the near future to start immunotherapy   I discussed the assessment and treatment plan with the patient. The patient was provided an opportunity to ask questions and all were answered. The patient agreed with the plan and demonstrated an understanding of the instructions.   The patient was advised to call back or seek an in-person evaluation if the symptoms worsen or if the condition fails to improve as anticipated.  I provided 20 minutes of non face-to-face telephone visit time during  this encounter.  The time was dedicated to reviewing his disease status, treatment choices and complications related to therapy.Zola Button, MD 05/25/2021 10:36 AM

## 2021-05-27 ENCOUNTER — Other Ambulatory Visit: Payer: BC Managed Care – PPO

## 2021-05-28 ENCOUNTER — Ambulatory Visit: Payer: BC Managed Care – PPO

## 2021-06-04 ENCOUNTER — Telehealth: Payer: Self-pay | Admitting: Oncology

## 2021-06-04 NOTE — Telephone Encounter (Signed)
Sch per

## 2021-06-05 ENCOUNTER — Other Ambulatory Visit: Payer: Self-pay

## 2021-06-05 ENCOUNTER — Encounter: Payer: Self-pay | Admitting: Oncology

## 2021-06-05 ENCOUNTER — Ambulatory Visit: Payer: BC Managed Care – PPO | Admitting: Adult Health

## 2021-06-05 ENCOUNTER — Encounter: Payer: Self-pay | Admitting: Adult Health

## 2021-06-05 DIAGNOSIS — F411 Generalized anxiety disorder: Secondary | ICD-10-CM | POA: Diagnosis not present

## 2021-06-05 MED ORDER — ALPRAZOLAM 1 MG PO TABS: 1.0000 mg | ORAL_TABLET | Freq: Every evening | ORAL | 2 refills | Status: DC | PRN

## 2021-06-05 NOTE — Progress Notes (Signed)
Quashaun Klimczak Keys [id number] 31-Mar-1981 40 y.o.  Subjective:   Patient ID:  Steve Jimenez is a 40 y.o. (DOB [birth year]) male.  Chief Complaint: No chief complaint on file.   HPI Steve Jimenez presents to the office today for follow-up of anxiety.  Describes mood today as "ok". Pleasant. Mood symptoms - denies depression and irritability. Reports increased anxiety at times. Denies panic attacks. Stating "I'm doing alright". Recovering from kidney cancer. Feels like Xanax continues to work well, but would like to increase dose with increased stressors. Seeing therapist - Elnora Morrison. Stable interest and motivation. Taking medications as prescribed.  Energy levels stable. Active, does not have a regular exercise routine.  Enjoys some usuaIl interests and activities. Married. Lives with wife and 4 children. Parents local. Spending time with family. Appetite adequate. Weight stable - 216 pounds. Sleeps better some nights than others. Averages 4 to 6 hours.  Focus and concentration stable. Completing tasks. Managing aspects of household. 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  Denies AH or VH.  Previous medication trials: Xanax    Flowsheet Row Pre-Admission Testing 60 from 03/27/2021 in [facility] CATEGORY No Risk        Review of Systems:  Review of Systems  Musculoskeletal:  Negative for gait problem.  Neurological:  Negative for tremors.  Psychiatric/Behavioral:         Please refer to HPI   Medications: I have reviewed the patient's current medications.  Current Outpatient Medications  Medication Sig Dispense Refill   ALPRAZolam (XANAX) 1 MG tablet Take 1 tablet (1 mg total) by mouth at bedtime as needed for anxiety. 30 tablet 2   No current facility-administered medications for this visit.    Medication Side Effects: None  Allergies:  Allergies  Allergen Reactions   Bee Venom Anaphylaxis    Aspirin     Other reaction(s): as a child    Past Medical History:  Diagnosis Date   Anginal pain ([place]) 10/2019   due to anxitey   Anxiety     Past Medical History, Surgical history, Social history, and Family history were reviewed and updated as appropriate.   Please see review of systems for further details on the patient's review from today.   Objective:   Physical Exam:  There were no vitals taken for this visit.  Physical Exam Constitutional:      General: He is not in acute distress. Musculoskeletal:        General: No deformity.  Neurological:     Mental Status: He is alert and oriented to person, place, and time.     Coordination: Coordination normal.  Psychiatric:        Attention and Perception: Attention and perception normal. He does not perceive auditory or visual hallucinations.        Mood and Affect: Mood normal. Mood is not anxious or depressed. Affect is not labile, blunt, angry or inappropriate.        Speech: Speech normal.        Behavior: Behavior normal.        Thought Content: Thought content normal. Thought content is not paranoid or delusional. Thought content does not include homicidal or suicidal ideation. Thought content does not include homicidal or suicidal plan.        Cognition and Memory: Cognition and memory normal.        Judgment: Judgment normal.     Comments: Insight intact  Lab Review:     Component Value Date/Time   NA 133 (L) 04/04/2021 0515   K 4.1 04/04/2021 0515   CL 101 04/04/2021 0515   CO2 24 04/04/2021 0515   GLUCOSE 154 (H) 04/04/2021 0515   BUN 13 04/04/2021 0515   CREATININE 1.10 05/21/2021 1412   CALCIUM 9.0 04/04/2021 0515   GFRNONAA >60 04/04/2021 0515       Component Value Date/Time   WBC 11.6 (H) 04/04/2021 0515   RBC 4.68 04/04/2021 0515   HGB 14.2 04/04/2021 0515   HCT 41.9 04/04/2021 0515   PLT 334 04/04/2021 0515   MCV 89.5 04/04/2021 0515   MCH 30.3 04/04/2021 0515   MCHC 33.9 04/04/2021  0515   RDW 11.7 04/04/2021 0515    No results found for: POCLITH, LITHIUM   No results found for: PHENYTOIN, PHENOBARB, VALPROATE, CBMZ   .res Assessment: Plan:     Plan:  PDMP reviewed  1. Increase Xanax 0.'5mg'$  to '1mg'$  daily PRN anxiety  Read and reviewed note with patient for accuracy.   RTC 6 months - will call in 3 months for refills.  Patient advised to contact office with any questions, adverse effects, or acute worsening in signs and symptoms.  Discussed potential benefits, risk, and side effects of benzodiazepines to include potential risk of tolerance and dependence, as well as possible drowsiness.  Advised patient not to drive if experiencing drowsiness and to take lowest possible effective dose to minimize risk of dependence and tolerance.  Greater than 50% of face to face time with patient was spent on counseling and coordination of care. We discussed anxiety and panic attacks. Discussed continuing to seeing his therapist.    Diagnoses and all orders for this visit:  Generalized anxiety disorder -     ALPRAZolam (XANAX) 1 MG tablet; Take 1 tablet (1 mg total) by mouth at bedtime as needed for anxiety.    Please see After Visit Summary for patient specific instructions.  Future Appointments  Date Time Provider [place]  06/16/2021  2:00 PM [facility] None  06/17/2021  3:00 PM [facility] INFUSION [facility] None  07/28/2021  9:00 AM [facility] None  07/28/2021  9:30 AM Shadad, Mathis Dad, MD [facility] None  07/28/2021 10:30 AM [facility] INFUSION [facility] None  09/08/2021  1:15 PM [facility] LAB [facility] None  09/08/2021  2:00 PM [facility] INFUSION [facility] None    No orders of the defined types were placed in this encounter.   -------------------------------

## 2021-06-10 NOTE — Progress Notes (Signed)
Pharmacist Chemotherapy Monitoring - Initial Assessment    Anticipated start date: 05/17/21   The following has been reviewed per standard work regarding the patient's treatment regimen: The patient's diagnosis, treatment plan and drug doses, and organ/hematologic function Lab orders and baseline tests specific to treatment regimen  The treatment plan start date, drug sequencing, and pre-medications Prior authorization status  Patient's documented medication list, including drug-drug interaction screen and prescriptions for anti-emetics and supportive care specific to the treatment regimen The drug concentrations, fluid compatibility, administration routes, and timing of the medications to be used The patient's access for treatment and lifetime cumulative dose history, if applicable  The patient's medication allergies and previous infusion related reactions, if applicable   Changes made to treatment plan:  treatment plan date  Follow up needed:  Pending authorization for treatment    Judge Stall, Eugenio Saenz, 06/10/2021  1:13 PM

## 2021-06-16 ENCOUNTER — Inpatient Hospital Stay: Payer: BC Managed Care – PPO | Attending: Oncology

## 2021-06-16 ENCOUNTER — Other Ambulatory Visit: Payer: Self-pay

## 2021-06-16 DIAGNOSIS — Z5112 Encounter for antineoplastic immunotherapy: Secondary | ICD-10-CM | POA: Diagnosis not present

## 2021-06-16 DIAGNOSIS — C642 Malignant neoplasm of left kidney, except renal pelvis: Secondary | ICD-10-CM | POA: Diagnosis not present

## 2021-06-16 DIAGNOSIS — Z79899 Other long term (current) drug therapy: Secondary | ICD-10-CM | POA: Insufficient documentation

## 2021-06-16 LAB — CMP (CANCER CENTER ONLY)
ALT: 43 U/L (ref 0–44)
AST: 33 U/L (ref 15–41)
Albumin: 4.3 g/dL (ref 3.5–5.0)
Alkaline Phosphatase: 88 U/L (ref 38–126)
Anion gap: 12 (ref 5–15)
BUN: 20 mg/dL (ref 6–20)
CO2: 23 mmol/L (ref 22–32)
Calcium: 10.5 mg/dL — ABNORMAL HIGH (ref 8.9–10.3)
Chloride: 104 mmol/L (ref 98–111)
Creatinine: 1.11 mg/dL (ref 0.61–1.24)
GFR, Estimated: >60 mL/min (ref >60)
Glucose, Bld: 93 mg/dL (ref 70–99)
Potassium: 4 mmol/L (ref 3.5–5.1)
Sodium: 139 mmol/L (ref 135–145)
Total Bilirubin: 0.5 mg/dL (ref 0.3–1.2)
Total Protein: 8.8 g/dL — ABNORMAL HIGH (ref 6.5–8.1)

## 2021-06-16 LAB — TSH: TSH: 2.344 u[IU]/mL (ref 0.350–4.500)

## 2021-06-16 LAB — CBC WITH DIFFERENTIAL (CANCER CENTER ONLY)
Abs Immature Granulocytes: 0.01 10*3/uL (ref 0.00–0.07)
Basophils Absolute: 0 10*3/uL (ref 0.0–0.1)
Basophils Relative: 1 %
Eosinophils Absolute: 0.2 10*3/uL (ref 0.0–0.5)
Eosinophils Relative: 3 %
HCT: 46 % (ref 39.0–52.0)
Hemoglobin: 16.1 g/dL (ref 13.0–17.0)
Immature Granulocytes: 0 %
Lymphocytes Relative: 26 %
Lymphs Abs: 1.4 10*3/uL (ref 0.7–4.0)
MCH: 30.5 pg (ref 26.0–34.0)
MCHC: 35 g/dL (ref 30.0–36.0)
MCV: 87.1 fL (ref 80.0–100.0)
Monocytes Absolute: 0.6 10*3/uL (ref 0.1–1.0)
Monocytes Relative: 11 %
Neutro Abs: 3.2 10*3/uL (ref 1.7–7.7)
Neutrophils Relative %: 59 %
Platelet Count: 256 10*3/uL (ref 150–400)
RBC: 5.28 MIL/uL (ref 4.22–5.81)
RDW: 12.1 % (ref 11.5–15.5)
WBC Count: 5.5 10*3/uL (ref 4.0–10.5)
nRBC: 0 % (ref 0.0–0.2)

## 2021-06-17 ENCOUNTER — Inpatient Hospital Stay: Payer: BC Managed Care – PPO

## 2021-06-17 VITALS — BP 137/89 | HR 60 | Resp 18 | Wt 231.8 lb

## 2021-06-17 DIAGNOSIS — C642 Malignant neoplasm of left kidney, except renal pelvis: Secondary | ICD-10-CM

## 2021-06-17 DIAGNOSIS — Z5112 Encounter for antineoplastic immunotherapy: Secondary | ICD-10-CM | POA: Diagnosis not present

## 2021-06-17 DIAGNOSIS — Z79899 Other long term (current) drug therapy: Secondary | ICD-10-CM | POA: Diagnosis not present

## 2021-06-17 MED ORDER — SODIUM CHLORIDE 0.9 % IV SOLN: Freq: Once | INTRAVENOUS | Status: AC

## 2021-06-17 MED ORDER — SODIUM CHLORIDE 0.9 % IV SOLN
400.0000 mg | Freq: Once | INTRAVENOUS | Status: AC
  Administered 2021-06-17: 400 mg via INTRAVENOUS
  Filled 2021-06-17: qty 16

## 2021-06-17 NOTE — Patient Instructions (Signed)
[facility]  Discharge Instructions: Thank you for choosing [facility] to provide your oncology and hematology care.   If you have a lab appointment with the Cancer Center, please go directly to the Cancer Center and check in at the registration area.   Wear comfortable clothing and clothing appropriate for easy access to any Portacath or PICC line.   We strive to give you quality time with your provider. You may need to reschedule your appointment if you arrive late (15 or more minutes).  Arriving late affects you and other patients whose appointments are after yours.  Also, if you miss three or more appointments without notifying the office, you may be dismissed from the clinic at the provider's discretion.      For prescription refill requests, have your pharmacy contact our office and allow 72 hours for refills to be completed.    Today you received the following chemotherapy and/or immunotherapy agents Keytruda      To help prevent nausea and vomiting after your treatment, we encourage you to take your nausea medication as directed.  BELOW ARE SYMPTOMS THAT SHOULD BE REPORTED IMMEDIATELY: *FEVER GREATER THAN 100.4 F (38 C) OR HIGHER *CHILLS OR SWEATING *NAUSEA AND VOMITING THAT IS NOT CONTROLLED WITH YOUR NAUSEA MEDICATION *UNUSUAL SHORTNESS OF BREATH *UNUSUAL BRUISING OR BLEEDING *URINARY PROBLEMS (pain or burning when urinating, or frequent urination) *BOWEL PROBLEMS (unusual diarrhea, constipation, pain near the anus) TENDERNESS IN MOUTH AND THROAT WITH OR WITHOUT PRESENCE OF ULCERS (sore throat, sores in mouth, or a toothache) UNUSUAL RASH, SWELLING OR PAIN  UNUSUAL VAGINAL DISCHARGE OR ITCHING   Items with * indicate a potential emergency and should be followed up as soon as possible or go to the Emergency Department if any problems should occur.  Please show the CHEMOTHERAPY ALERT CARD or IMMUNOTHERAPY ALERT CARD at check-in to  the Emergency Department and triage nurse.  Should you have questions after your visit or need to cancel or reschedule your appointment, please contact [facility]  Dept: [phone]  and follow the prompts.  Office hours are 8:00 a.m. to 4:30 p.m. Monday - Friday. Please note that voicemails left after 4:00 p.m. may not be returned until the following business day.  We are closed weekends and major holidays. You have access to a nurse at all times for urgent questions. Please call the main number to the clinic Dept: [phone] and follow the prompts.   For any non-urgent questions, you may also contact your provider using MyChart. We now offer e-Visits for anyone 18 and older to request care online for non-urgent symptoms. For details visit [email or web address].   Also download the MyChart app! Go to the app store, search "MyChart", open the app, select [facility], and log in with your MyChart username and password.  Due to Covid, a mask is required upon entering the hospital/clinic. If you do not have a mask, one will be given to you upon arrival. For doctor visits, patients may have 1 support person aged 18 or older with them. For treatment visits, patients cannot have anyone with them due to current Covid guidelines and our immunocompromised population.   Pembrolizumab injection What is this medication? PEMBROLIZUMAB (pem broe liz ue mab) is a monoclonal antibody. It is used totreat certain types of cancer. This medicine may be used for other purposes; ask your health care provider orpharmacist if you have questions. COMMON BRAND NAME(S): Keytruda What should I   tell my care team before I take this medication? They need to know if you have any of these conditions: autoimmune diseases like Crohn's disease, ulcerative colitis, or lupus have had or planning to have an allogeneic stem cell transplant (uses someone else's stem cells) history of organ  transplant history of chest radiation nervous system problems like myasthenia gravis or Guillain-Barre syndrome an unusual or allergic reaction to pembrolizumab, other medicines, foods, dyes, or preservatives pregnant or trying to get pregnant breast-feeding How should I use this medication? This medicine is for infusion into a vein. It is given by a health careprofessional in a hospital or clinic setting. A special MedGuide will be given to you before each treatment. Be sure to readthis information carefully each time. Talk to your pediatrician regarding the use of this medicine in children. While this drug may be prescribed for children as young as 6 months for selectedconditions, precautions do apply. Overdosage: If you think you have taken too much of this medicine contact apoison control center or emergency room at once. NOTE: This medicine is only for you. Do not share this medicine with others. What if I miss a dose? It is important not to miss your dose. Call your doctor or health careprofessional if you are unable to keep an appointment. What may interact with this medication? Interactions have not been studied. This list may not describe all possible interactions. Give your health care provider a list of all the medicines, herbs, non-prescription drugs, or dietary supplements you use. Also tell them if you smoke, drink alcohol, or use illegaldrugs. Some items may interact with your medicine. What should I watch for while using this medication? Your condition will be monitored carefully while you are receiving thismedicine. You may need blood work done while you are taking this medicine. Do not become pregnant while taking this medicine or for 4 months after stopping it. Women should inform their doctor if they wish to become pregnant or think they might be pregnant. There is a potential for serious side effects to an unborn child. Talk to your health care professional or pharmacist for  more information. Do not breast-feed an infant while taking this medicine orfor 4 months after the last dose. What side effects may I notice from receiving this medication? Side effects that you should report to your doctor or health care professionalas soon as possible: allergic reactions like skin rash, itching or hives, swelling of the face, lips, or tongue bloody or black, tarry breathing problems changes in vision chest pain chills confusion constipation cough diarrhea dizziness or feeling faint or lightheaded fast or irregular heartbeat fever flushing joint pain low blood counts - this medicine may decrease the number of white blood cells, red blood cells and platelets. You may be at increased risk for infections and bleeding. muscle pain muscle weakness pain, tingling, numbness in the hands or feet persistent headache redness, blistering, peeling or loosening of the skin, including inside the mouth signs and symptoms of high blood sugar such as dizziness; dry mouth; dry skin; fruity breath; nausea; stomach pain; increased hunger or thirst; increased urination signs and symptoms of kidney injury like trouble passing urine or change in the amount of urine signs and symptoms of liver injury like dark urine, light-colored stools, loss of appetite, nausea, right upper belly pain, yellowing of the eyes or skin sweating swollen lymph nodes weight loss Side effects that usually do not require medical attention (report to yourdoctor or health care professional if they continue   or are bothersome): decreased appetite hair loss tiredness This list may not describe all possible side effects. Call your doctor for medical advice about side effects. You may report side effects to FDA at1-800-FDA-1088. Where should I keep my medication? This drug is given in a hospital or clinic and will not be stored at home. NOTE: This sheet is a summary. It may not cover all possible information. If you  have questions about this medicine, talk to your doctor, pharmacist, orhealth care provider.  2022 Elsevier/Gold Standard (2019-08-22 21:44:53)   

## 2021-07-09 ENCOUNTER — Ambulatory Visit: Payer: BC Managed Care – PPO | Admitting: Oncology

## 2021-07-09 ENCOUNTER — Other Ambulatory Visit: Payer: BC Managed Care – PPO

## 2021-07-09 ENCOUNTER — Ambulatory Visit: Payer: BC Managed Care – PPO

## 2021-07-16 DIAGNOSIS — C642 Malignant neoplasm of left kidney, except renal pelvis: Secondary | ICD-10-CM | POA: Diagnosis not present

## 2021-07-28 ENCOUNTER — Inpatient Hospital Stay (HOSPITAL_BASED_OUTPATIENT_CLINIC_OR_DEPARTMENT_OTHER): Payer: BC Managed Care – PPO | Admitting: Oncology

## 2021-07-28 ENCOUNTER — Other Ambulatory Visit: Payer: Self-pay

## 2021-07-28 ENCOUNTER — Inpatient Hospital Stay: Payer: BC Managed Care – PPO | Attending: Oncology

## 2021-07-28 ENCOUNTER — Inpatient Hospital Stay: Payer: BC Managed Care – PPO

## 2021-07-28 VITALS — BP 127/82 | HR 51 | Temp 98.0°F | Resp 18 | Ht 67.0 in | Wt 237.0 lb

## 2021-07-28 DIAGNOSIS — Z886 Allergy status to analgesic agent status: Secondary | ICD-10-CM | POA: Diagnosis not present

## 2021-07-28 DIAGNOSIS — R634 Abnormal weight loss: Secondary | ICD-10-CM | POA: Insufficient documentation

## 2021-07-28 DIAGNOSIS — C642 Malignant neoplasm of left kidney, except renal pelvis: Secondary | ICD-10-CM

## 2021-07-28 DIAGNOSIS — Z79899 Other long term (current) drug therapy: Secondary | ICD-10-CM | POA: Diagnosis not present

## 2021-07-28 DIAGNOSIS — R5383 Other fatigue: Secondary | ICD-10-CM | POA: Insufficient documentation

## 2021-07-28 DIAGNOSIS — Z91030 Bee allergy status: Secondary | ICD-10-CM | POA: Insufficient documentation

## 2021-07-28 DIAGNOSIS — Z5112 Encounter for antineoplastic immunotherapy: Secondary | ICD-10-CM | POA: Diagnosis not present

## 2021-07-28 LAB — CBC WITH DIFFERENTIAL (CANCER CENTER ONLY)
Abs Immature Granulocytes: 0.01 10*3/uL (ref 0.00–0.07)
Basophils Absolute: 0 10*3/uL (ref 0.0–0.1)
Basophils Relative: 1 %
Eosinophils Absolute: 0.3 10*3/uL (ref 0.0–0.5)
Eosinophils Relative: 7 %
HCT: 42.2 % (ref 39.0–52.0)
Hemoglobin: 14.7 g/dL (ref 13.0–17.0)
Immature Granulocytes: 0 %
Lymphocytes Relative: 21 %
Lymphs Abs: 1 10*3/uL (ref 0.7–4.0)
MCH: 30.6 pg (ref 26.0–34.0)
MCHC: 34.8 g/dL (ref 30.0–36.0)
MCV: 87.7 fL (ref 80.0–100.0)
Monocytes Absolute: 0.5 10*3/uL (ref 0.1–1.0)
Monocytes Relative: 10 %
Neutro Abs: 2.8 10*3/uL (ref 1.7–7.7)
Neutrophils Relative %: 61 %
Platelet Count: 284 10*3/uL (ref 150–400)
RBC: 4.81 MIL/uL (ref 4.22–5.81)
RDW: 12.2 % (ref 11.5–15.5)
WBC Count: 4.6 10*3/uL (ref 4.0–10.5)
nRBC: 0 % (ref 0.0–0.2)

## 2021-07-28 LAB — CMP (CANCER CENTER ONLY)
ALT: 44 U/L (ref 0–44)
AST: 32 U/L (ref 15–41)
Albumin: 3.8 g/dL (ref 3.5–5.0)
Alkaline Phosphatase: 86 U/L (ref 38–126)
Anion gap: 7 (ref 5–15)
BUN: 15 mg/dL (ref 6–20)
CO2: 22 mmol/L (ref 22–32)
Calcium: 9.6 mg/dL (ref 8.9–10.3)
Chloride: 108 mmol/L (ref 98–111)
Creatinine: 1.05 mg/dL (ref 0.61–1.24)
GFR, Estimated: >60 mL/min (ref >60)
Glucose, Bld: 107 mg/dL — ABNORMAL HIGH (ref 70–99)
Potassium: 4.1 mmol/L (ref 3.5–5.1)
Sodium: 137 mmol/L (ref 135–145)
Total Bilirubin: 0.6 mg/dL (ref 0.3–1.2)
Total Protein: 7.9 g/dL (ref 6.5–8.1)

## 2021-07-28 LAB — TSH: TSH: 1.173 u[IU]/mL (ref 0.320–4.118)

## 2021-07-28 MED ORDER — SODIUM CHLORIDE 0.9 % IV SOLN
400.0000 mg | Freq: Once | INTRAVENOUS | Status: AC
  Administered 2021-07-28: 400 mg via INTRAVENOUS
  Filled 2021-07-28: qty 16

## 2021-07-28 MED ORDER — SODIUM CHLORIDE 0.9 % IV SOLN: Freq: Once | INTRAVENOUS | Status: AC

## 2021-07-28 NOTE — Patient Instructions (Signed)
[facility]  Discharge Instructions: Thank you for choosing [facility] to provide your oncology and hematology care.   If you have a lab appointment with the Cancer Center, please go directly to the Cancer Center and check in at the registration area.   Wear comfortable clothing and clothing appropriate for easy access to any Portacath or PICC line.   We strive to give you quality time with your provider. You may need to reschedule your appointment if you arrive late (15 or more minutes).  Arriving late affects you and other patients whose appointments are after yours.  Also, if you miss three or more appointments without notifying the office, you may be dismissed from the clinic at the provider's discretion.      For prescription refill requests, have your pharmacy contact our office and allow 72 hours for refills to be completed.    Today you received the following chemotherapy and/or immunotherapy agents Keytruda      To help prevent nausea and vomiting after your treatment, we encourage you to take your nausea medication as directed.  BELOW ARE SYMPTOMS THAT SHOULD BE REPORTED IMMEDIATELY: *FEVER GREATER THAN 100.4 F (38 C) OR HIGHER *CHILLS OR SWEATING *NAUSEA AND VOMITING THAT IS NOT CONTROLLED WITH YOUR NAUSEA MEDICATION *UNUSUAL SHORTNESS OF BREATH *UNUSUAL BRUISING OR BLEEDING *URINARY PROBLEMS (pain or burning when urinating, or frequent urination) *BOWEL PROBLEMS (unusual diarrhea, constipation, pain near the anus) TENDERNESS IN MOUTH AND THROAT WITH OR WITHOUT PRESENCE OF ULCERS (sore throat, sores in mouth, or a toothache) UNUSUAL RASH, SWELLING OR PAIN  UNUSUAL VAGINAL DISCHARGE OR ITCHING   Items with * indicate a potential emergency and should be followed up as soon as possible or go to the Emergency Department if any problems should occur.  Please show the CHEMOTHERAPY ALERT CARD or IMMUNOTHERAPY ALERT CARD at check-in to  the Emergency Department and triage nurse.  Should you have questions after your visit or need to cancel or reschedule your appointment, please contact [facility]  Dept: [phone]  and follow the prompts.  Office hours are 8:00 a.m. to 4:30 p.m. Monday - Friday. Please note that voicemails left after 4:00 p.m. may not be returned until the following business day.  We are closed weekends and major holidays. You have access to a nurse at all times for urgent questions. Please call the main number to the clinic Dept: [phone] and follow the prompts.   For any non-urgent questions, you may also contact your provider using MyChart. We now offer e-Visits for anyone 18 and older to request care online for non-urgent symptoms. For details visit [email or web address].   Also download the MyChart app! Go to the app store, search "MyChart", open the app, select , and log in with your MyChart username and password.  Due to Covid, a mask is required upon entering the hospital/clinic. If you do not have a mask, one will be given to you upon arrival. For doctor visits, patients may have 1 support person aged 18 or older with them. For treatment visits, patients cannot have anyone with them due to current Covid guidelines and our immunocompromised population.   Pembrolizumab injection What is this medication? PEMBROLIZUMAB (pem broe liz ue mab) is a monoclonal antibody. It is used totreat certain types of cancer. This medicine may be used for other purposes; ask your health care provider orpharmacist if you have questions. COMMON BRAND NAME(S): Keytruda What should I   tell my care team before I take this medication? They need to know if you have any of these conditions: autoimmune diseases like Crohn's disease, ulcerative colitis, or lupus have had or planning to have an allogeneic stem cell transplant (uses someone else's stem cells) history of organ  transplant history of chest radiation nervous system problems like myasthenia gravis or Guillain-Barre syndrome an unusual or allergic reaction to pembrolizumab, other medicines, foods, dyes, or preservatives pregnant or trying to get pregnant breast-feeding How should I use this medication? This medicine is for infusion into a vein. It is given by a health careprofessional in a hospital or clinic setting. A special MedGuide will be given to you before each treatment. Be sure to readthis information carefully each time. Talk to your pediatrician regarding the use of this medicine in children. While this drug may be prescribed for children as young as 6 months for selectedconditions, precautions do apply. Overdosage: If you think you have taken too much of this medicine contact apoison control center or emergency room at once. NOTE: This medicine is only for you. Do not share this medicine with others. What if I miss a dose? It is important not to miss your dose. Call your doctor or health careprofessional if you are unable to keep an appointment. What may interact with this medication? Interactions have not been studied. This list may not describe all possible interactions. Give your health care provider a list of all the medicines, herbs, non-prescription drugs, or dietary supplements you use. Also tell them if you smoke, drink alcohol, or use illegaldrugs. Some items may interact with your medicine. What should I watch for while using this medication? Your condition will be monitored carefully while you are receiving thismedicine. You may need blood work done while you are taking this medicine. Do not become pregnant while taking this medicine or for 4 months after stopping it. Women should inform their doctor if they wish to become pregnant or think they might be pregnant. There is a potential for serious side effects to an unborn child. Talk to your health care professional or pharmacist for  more information. Do not breast-feed an infant while taking this medicine orfor 4 months after the last dose. What side effects may I notice from receiving this medication? Side effects that you should report to your doctor or health care professionalas soon as possible: allergic reactions like skin rash, itching or hives, swelling of the face, lips, or tongue bloody or black, tarry breathing problems changes in vision chest pain chills confusion constipation cough diarrhea dizziness or feeling faint or lightheaded fast or irregular heartbeat fever flushing joint pain low blood counts - this medicine may decrease the number of white blood cells, red blood cells and platelets. You may be at increased risk for infections and bleeding. muscle pain muscle weakness pain, tingling, numbness in the hands or feet persistent headache redness, blistering, peeling or loosening of the skin, including inside the mouth signs and symptoms of high blood sugar such as dizziness; dry mouth; dry skin; fruity breath; nausea; stomach pain; increased hunger or thirst; increased urination signs and symptoms of kidney injury like trouble passing urine or change in the amount of urine signs and symptoms of liver injury like dark urine, light-colored stools, loss of appetite, nausea, right upper belly pain, yellowing of the eyes or skin sweating swollen lymph nodes weight loss Side effects that usually do not require medical attention (report to yourdoctor or health care professional if they continue   or are bothersome): decreased appetite hair loss tiredness This list may not describe all possible side effects. Call your doctor for medical advice about side effects. You may report side effects to FDA at1-800-FDA-1088. Where should I keep my medication? This drug is given in a hospital or clinic and will not be stored at home. NOTE: This sheet is a summary. It may not cover all possible information. If you  have questions about this medicine, talk to your doctor, pharmacist, orhealth care provider.  2022 Elsevier/Gold Standard (2019-08-22 21:44:53)   

## 2021-07-28 NOTE — Progress Notes (Signed)
Hematology and Oncology Follow Up   Steve Jimenez [id number] May 15, 1981 40 y.o. 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   Prior Therapy:  He is status post robot-assisted laparoscopic left radical nephrectomy completed on April 03, 2021.  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  Margins are negative at that time with the final pathological staging T2BNX.  Current therapy: Pembrolizumab 400 mg every 6 weeks started on 06/17/2021.  Is here for cycle 2 of therapy.  Interim History: Steve Jimenez returns today for a follow-up visit.  Since last visit, he reports no major changes in his health.  He tolerated first cycle of Pembrolizumab without any major complaints.  He does report some mild fatigue and tiredness and decrease in his appetite.  He has lost some weight however.  He denies any shortness of breath or difficulty breathing.  He denies any worsening diarrhea.   Medications: Updated on review. Current Outpatient Medications  Medication Sig Dispense Refill   ALPRAZolam (XANAX) 1 MG tablet Take 1 tablet (1 mg total) by mouth at bedtime as needed for anxiety. 30 tablet 2   No current facility-administered medications for this visit.     Allergies:  Allergies  Allergen Reactions   Bee Venom Anaphylaxis   Aspirin     Other reaction(s): as a child   Physical exam: Blood pressure 127/82, pulse (!) 51, temperature 98 F (36.7 C), temperature source Oral, resp. rate 18, height 5\' 7"  (1.702 m), weight 237 lb (107.5 kg), SpO2 100 %.  ECOG 0  General appearance: Comfortable appearing without any discomfort Head: Normocephalic without any trauma Oropharynx: Mucous membranes are moist and pink without any thrush or ulcers. Eyes: Pupils are equal and round  reactive to light. Lymph nodes: No cervical, supraclavicular, inguinal or axillary lymphadenopathy.   Heart:regular rate and rhythm.  S1 and S2 without leg edema. Lung: Clear without any rhonchi or wheezes.  No dullness to percussion. Abdomin: Soft, nontender, nondistended with good bowel sounds.  No hepatosplenomegaly. Musculoskeletal: No joint deformity or effusion.  Full range of motion noted. Neurological: No deficits noted on motor, sensory and deep tendon reflex exam. Skin: No petechial rash or dryness.  Appeared moist.      Lab Results: Lab Results  Component Value Date   WBC 5.5 06/16/2021   HGB 16.1 06/16/2021   HCT 46.0 06/16/2021   MCV 87.1 06/16/2021   PLT 256 06/16/2021     Chemistry      Component Value Date/Time   NA 139 06/16/2021 1416   K 4.0 06/16/2021 1416   CL 104 06/16/2021 1416   CO2 23 06/16/2021 1416   BUN 20 06/16/2021 1416   CREATININE 1.11 06/16/2021 1416      Component Value Date/Time   CALCIUM 10.5 (H) 06/16/2021 1416   ALKPHOS 88 06/16/2021 1416   AST 33 06/16/2021 1416   ALT 43 06/16/2021 1416   BILITOT 0.5 06/16/2021 1416          Impression and Plan:  40 year old with:  1.  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     He is currently receiving adjuvant immunotherapy after surgical resection for high risk kidney cancer.  The rationale for using adjuvant immunotherapy were reiterated as well as the data to support this use was  discussed.  I also have discussed the need for continued active surveillance and repeat imaging studies periodically.  Complication associated with immunotherapy were reviewed today.  GI toxicity as well as immune mediated issues were reiterated.  He is agreeable to continue at this time.  2.  Antiemetics: Prescription for Compazine will be made available to him.  3.  IV access: Peripheral veins are currently in use without any issues.  4.  Immune mediated  complications.  These issues including pneumonitis, colitis and thyroid disease.  I continue to educate him about these potential issues.  5.  Follow-up: Will be in 6 weeks for repeat evaluation.  30 minutes were dedicated to this visit. The time was dedicated to updating his disease status, treatment choices and addressing complications related to his therapy.   Zola Button, MD 07/28/2021 9:19 AM

## 2021-07-29 ENCOUNTER — Encounter: Payer: Self-pay | Admitting: Oncology

## 2021-08-03 ENCOUNTER — Ambulatory Visit (INDEPENDENT_AMBULATORY_CARE_PROVIDER_SITE_OTHER): Payer: BC Managed Care – PPO

## 2021-08-03 ENCOUNTER — Encounter (HOSPITAL_COMMUNITY): Payer: Self-pay

## 2021-08-03 ENCOUNTER — Other Ambulatory Visit: Payer: Self-pay

## 2021-08-03 ENCOUNTER — Ambulatory Visit (HOSPITAL_COMMUNITY)
Admission: RE | Admit: 2021-08-03 | Discharge: 2021-08-03 | Disposition: A | Discharge status: Home / Self Care | Payer: BC Managed Care – PPO | Source: Ambulatory Visit | Attending: Student | Admitting: Student

## 2021-08-03 ENCOUNTER — Ambulatory Visit (HOSPITAL_COMMUNITY): Payer: BC Managed Care – PPO

## 2021-08-03 VITALS — BP 142/86 | HR 65 | Temp 98.6°F | Resp 16

## 2021-08-03 DIAGNOSIS — Y30XXXA Falling, jumping or pushed from a high place, undetermined intent, initial encounter: Secondary | ICD-10-CM | POA: Diagnosis not present

## 2021-08-03 DIAGNOSIS — S9031XA Contusion of right foot, initial encounter: Secondary | ICD-10-CM | POA: Diagnosis not present

## 2021-08-03 DIAGNOSIS — S99911A Unspecified injury of right ankle, initial encounter: Secondary | ICD-10-CM | POA: Diagnosis not present

## 2021-08-03 DIAGNOSIS — M79671 Pain in right foot: Secondary | ICD-10-CM | POA: Diagnosis not present

## 2021-08-03 DIAGNOSIS — M25571 Pain in right ankle and joints of right foot: Secondary | ICD-10-CM

## 2021-08-03 NOTE — ED Triage Notes (Signed)
Pt presents with right ankle/ heel pain xs 3 days.

## 2021-08-03 NOTE — ED Provider Notes (Signed)
[place]    CSN: [id number] Arrival date & time: 08/03/21  8850      History   Chief Complaint No chief complaint on file.   HPI Steve Jimenez is a 40 y.o. male presenting with right foot and ankle pain following jumping over 6 foot fence.  Medical history noncontributory.  Patient describes playing hide and go seek with his kids, he ended up jumping over fence and then landed directly onto his right heel.  Now with significant pain over the heel and difficulty ambulating due to this.  Pain seems to radiate to the ankle.  Denies sensation changes.  Denies falls or injury elsewhere.  HPI  Past Medical History:  Diagnosis Date   Anginal pain ([place]) 10/2019   due to anxitey   Anxiety     Patient Active Problem List   Diagnosis Date Noted   Malignant neoplasm of left kidney ([place]) 05/14/2021   Renal mass 04/03/2021   Acute stress disorder 11/28/2020   Dyslipidemia 11/28/2020   Sciatica 11/28/2020   Atypical chest pain 07/27/2019   Hyperlipidemia 07/27/2019    Past Surgical History:  Procedure Laterality Date   APPENDECTOMY     age 24   ROBOT ASSISTED LAPAROSCOPIC NEPHRECTOMY Left 04/03/2021   Procedure: XI ROBOTIC ASSISTED LAPAROSCOPIC NEPHRECTOMY;  Surgeon: Ceasar Mons, MD;  Location: [facility];  Service: Urology;  Laterality: Left;       Home Medications    Prior to Admission medications   Medication Sig Start Date End Date Taking? Authorizing Provider  ALPRAZolam Duanne Moron) 1 MG tablet Take 1 tablet (1 mg total) by mouth at bedtime as needed for anxiety. 06/05/21   Mozingo, Berdie Ogren, NP    Family History Family History  Problem Relation Age of Onset   Hypertension Mother    Healthy Father     Social History Social History   Tobacco Use   Smoking status: Former    Years: 10.00    Types: Cigarettes    Quit date: 2004    Years since quitting: 18.8   Smokeless tobacco: Never  Vaping Use   Vaping Use: Never used   Substance Use Topics   Alcohol use: Yes    Alcohol/week: 6.0 standard drinks    Types: 6 Standard drinks or equivalent per week    Comment: socially   Drug use: Never     Allergies   Bee venom and Aspirin   Review of Systems Review of Systems  Musculoskeletal:        R foot/ankle pain  All other systems reviewed and are negative.   Physical Exam Triage Vital Signs ED Triage Vitals  Enc Vitals Group     BP 08/03/21 0928 (!) 142/86     Pulse Rate 08/03/21 0928 65     Resp 08/03/21 0928 16     Temp 08/03/21 0928 98.6 F (37 C)     Temp Source 08/03/21 0928 Oral     SpO2 08/03/21 0928 98 %     Weight --      Height --      Head Circumference --      Peak Flow --      Pain Score 08/03/21 0925 7     Pain Loc --      Pain Edu? --      Excl. in [place]? --    No data found.  Updated Vital Signs BP (!) 142/86 (BP Location: Left Arm)   Pulse 65  Temp 98.6 F (37 C) (Oral)   Resp 16   SpO2 98%   Visual Acuity Right Eye Distance:   Left Eye Distance:   Bilateral Distance:    Right Eye Near:   Left Eye Near:    Bilateral Near:     Physical Exam Vitals reviewed.  Constitutional:      General: He is not in acute distress.    Appearance: Normal appearance. He is not ill-appearing.  HENT:     Head: Normocephalic and atraumatic.  Pulmonary:     Effort: Pulmonary effort is normal.  Musculoskeletal:     Comments: R foot: TTP over calcaneous with effusion. No bony abnormality. No midfoot or malleolar tenderness. Ambulating with pain. DP 2+, cap refill <2 seconds. no other pain or injury  Neurological:     General: No focal deficit present.     Mental Status: He is alert and oriented to person, place, and time.  Psychiatric:        Mood and Affect: Mood normal.        Behavior: Behavior normal.        Thought Content: Thought content normal.        Judgment: Judgment normal.     UC Treatments / Results  Labs (all labs ordered are listed, but only abnormal  results are displayed) Labs Reviewed - No data to display  EKG   Radiology DG Ankle Complete Right  Result Date: 08/03/2021 CLINICAL DATA:  Jumped from 6 ft height. Right ankle injury and pain. Initial encounter. EXAM: RIGHT ANKLE - COMPLETE 3+ VIEW COMPARISON:  None. FINDINGS: There is no evidence of fracture, dislocation, or joint effusion. There is no evidence of arthropathy or other focal bone abnormality. Soft tissues are unremarkable. IMPRESSION: Negative. Electronically Signed   By: Marlaine Hind M.D.   On: 08/03/2021 09:57   DG Foot Complete Right  Result Date: 08/03/2021 CLINICAL DATA:  Jumped from 6 ft height. Right foot pain. Initial encounter. EXAM: RIGHT FOOT COMPLETE - 3+ VIEW COMPARISON:  None. FINDINGS: There is no evidence of fracture or dislocation. There is no evidence of arthropathy or other focal bone abnormality. Soft tissues are unremarkable. IMPRESSION: Negative. Electronically Signed   By: Marlaine Hind M.D.   On: 08/03/2021 09:58    Procedures Procedures (including critical care time)  Medications Ordered in UC Medications - No data to display  Initial Impression / Assessment and Plan / UC Course  I have reviewed the triage vital signs and the nursing notes.  Pertinent labs & imaging results that were available during my care of the patient were reviewed by me and considered in my medical decision making (see chart for details).     This patient is a very pleasant 40 y.o. year old male presenting with contusion R calcaneous. Neurovascularly intact.   Xray R ankle- negative Xray R heel- negative.  RICE, f/u if symptoms persist.   ED return precautions discussed. Patient verbalizes understanding and agreement.     Final Clinical Impressions(s) / UC Diagnoses   Final diagnoses:  Contusion of right heel, initial encounter     Discharge Instructions      -You can take Tylenol up to 1000 mg 3 times daily, and ibuprofen up to 600 mg 3 times daily  with food.  You can take these together, or alternate every 3-4 hours. -Ice, rest -Follow-up with [place] or PCP in 1 week if symptoms not improving   ED Prescriptions   None  PDMP not reviewed this encounter.   Hazel Sams, PA-C 08/03/21 1031

## 2021-08-03 NOTE — Discharge Instructions (Addendum)
-  You can take Tylenol up to 1000 mg 3 times daily, and ibuprofen up to 600 mg 3 times daily with food.  You can take these together, or alternate every 3-4 hours. -Ice, rest -Follow-up with [place] or PCP in 1 week if symptoms not improving

## 2021-09-01 ENCOUNTER — Telehealth: Payer: Self-pay | Admitting: Adult Health

## 2021-09-01 ENCOUNTER — Other Ambulatory Visit: Payer: Self-pay | Admitting: Adult Health

## 2021-09-01 DIAGNOSIS — F411 Generalized anxiety disorder: Secondary | ICD-10-CM

## 2021-09-01 NOTE — Telephone Encounter (Signed)
Last filled 10/28 appt on 12/04/21

## 2021-09-01 NOTE — Telephone Encounter (Signed)
Next visit is 12/04/21. Requesting a refill on Alprazolam called to:  [facility], [place]  Phone:  [phone]  Fax:  [phone]

## 2021-09-01 NOTE — Telephone Encounter (Signed)
Pended.

## 2021-09-03 ENCOUNTER — Ambulatory Visit (HOSPITAL_COMMUNITY)
Admission: RE | Admit: 2021-09-03 | Discharge: 2021-09-03 | Disposition: A | Discharge status: Home / Self Care | Payer: BC Managed Care – PPO | Source: Ambulatory Visit | Attending: Oncology | Admitting: Oncology

## 2021-09-03 DIAGNOSIS — R911 Solitary pulmonary nodule: Secondary | ICD-10-CM | POA: Diagnosis not present

## 2021-09-03 DIAGNOSIS — K760 Fatty (change of) liver, not elsewhere classified: Secondary | ICD-10-CM | POA: Diagnosis not present

## 2021-09-03 DIAGNOSIS — C642 Malignant neoplasm of left kidney, except renal pelvis: Secondary | ICD-10-CM | POA: Diagnosis not present

## 2021-09-03 LAB — POCT I-STAT CREATININE: Creatinine, Ser: 1.4 mg/dL — ABNORMAL HIGH (ref 0.61–1.24)

## 2021-09-03 MED ORDER — IOHEXOL 350 MG/ML SOLN
80.0000 mL | Freq: Once | INTRAVENOUS | Status: AC | PRN
  Administered 2021-09-03: 80 mL via INTRAVENOUS

## 2021-09-08 ENCOUNTER — Inpatient Hospital Stay: Payer: BC Managed Care – PPO | Attending: Oncology | Admitting: Oncology

## 2021-09-08 ENCOUNTER — Other Ambulatory Visit: Payer: BC Managed Care – PPO

## 2021-09-08 ENCOUNTER — Inpatient Hospital Stay: Payer: BC Managed Care – PPO

## 2021-09-08 ENCOUNTER — Ambulatory Visit: Payer: BC Managed Care – PPO

## 2021-09-08 DIAGNOSIS — C642 Malignant neoplasm of left kidney, except renal pelvis: Secondary | ICD-10-CM

## 2021-09-08 NOTE — Progress Notes (Signed)
Hematology and Oncology Follow Up for Telemedicine Visits  Steve Jimenez [id number] Aug 07, 1981 40 y.o. 09/08/2021 12:19 PM College, [employer] [facility] [place], [place], [place]   I connected with Steve Jimenez on 09/08/21 at  1:30 PM EST by telephone visit and verified that I am speaking with the correct person using two identifiers.   I discussed the limitations, risks, security and privacy concerns of performing an evaluation and management service by telemedicine and the availability of in-person appointments. I also discussed with the patient that there may be a patient responsible charge related to this service. The patient expressed understanding and agreed to proceed.  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   Prior Therapy:  He is status post robot-assisted laparoscopic left radical nephrectomy completed on April 03, 2021.  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  Margins are negative at that time with the final pathological staging T2BNX.  Current therapy: Under evaluation for adjuvant therapy.  Interim History: Steve Jimenez reports feeling reasonably well without any major complaints.  He has tolerated Pembrolizumab without any significant toxicities although he has reported some GI issues including decrease in his appetite and slight weight loss.  No skin rashes or lesions.  No hospitalizations or illnesses.   Medications: Updated on review. Current Outpatient Medications  Medication Sig Dispense Refill   ALPRAZolam (XANAX) 1 MG tablet TAKE 1 TABLET BY MOUTH AT BEDTIME AS NEEDED FOR ANXIETY. 30 tablet 2   No current facility-administered medications for this visit.      Allergies:  Allergies  Allergen Reactions   Bee Venom Anaphylaxis   Aspirin     Other reaction(s): as a child       Lab Results: Lab Results  Component Value Date   WBC 4.6 07/28/2021   HGB 14.7 07/28/2021   HCT 42.2 07/28/2021   MCV 87.7 07/28/2021   PLT 284 07/28/2021     Chemistry      Component Value Date/Time   NA 137 07/28/2021 0911   K 4.1 07/28/2021 0911   CL 108 07/28/2021 0911   CO2 22 07/28/2021 0911   BUN 15 07/28/2021 0911   CREATININE 1.40 (H) 09/03/2021 0737   CREATININE 1.05 07/28/2021 0911      Component Value Date/Time   CALCIUM 9.6 07/28/2021 0911   ALKPHOS 86 07/28/2021 0911   AST 32 07/28/2021 0911   ALT 44 07/28/2021 0911   BILITOT 0.6 07/28/2021 0911       IMPRESSION: 1. Status post left nephrectomy. None no specific findings identified to suggest residual or recurrence of tumor or metastatic disease. 2. Decreased size of previously reported fat necrosis within the left nephrectomy bed. Also, previously reported nodule within the left nephrectomy bed is decreased in size in the interval. 3. Hepatic steatosis. 4. Focal area of skin thickening, subcutaneous soft tissue stranding and small subcutaneous nodule within the right lower quadrant ventral abdominal wall is new from the previous exam and is favored to represent inflammatory or infectious change perhaps related to injection site. Clinical correlation advised.   Impression and Plan:  40 year old with:  1.  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   He is currently on  Pembrolizumab and tolerated the first 2 cycles without any complaints.  Risks and benefits of continuing this treatment were reviewed at this time.  Complications including immune mediated issues, GI toxicity were discussed.  CT scan obtained on September 03, 2021 showed no evidence of metastatic disease.  After discussion he is agreeable  to continue but due to his travel will resume next infusion in January.    2.  Follow-up: January 2023 for repeat follow-up and evaluation.   I discussed the assessment and treatment plan with the patient. The patient was provided an opportunity to ask questions and all were answered. The patient agreed with the plan and demonstrated an understanding of the instructions.   The patient was advised to call back or seek an in-person evaluation if the symptoms worsen or if the condition fails to improve as anticipated.  I provided 20 minutes of non face-to-face telephone visit time during this encounter.  The time was spent on updating imaging studies, disease status update and outlining future plan of care.  Zola Button, MD 09/08/2021 12:19 PM

## 2021-09-20 ENCOUNTER — Other Ambulatory Visit: Payer: Self-pay

## 2021-10-21 ENCOUNTER — Inpatient Hospital Stay: Payer: BC Managed Care – PPO

## 2021-10-21 ENCOUNTER — Other Ambulatory Visit: Payer: Self-pay

## 2021-10-21 ENCOUNTER — Inpatient Hospital Stay (HOSPITAL_BASED_OUTPATIENT_CLINIC_OR_DEPARTMENT_OTHER): Payer: BC Managed Care – PPO | Admitting: Oncology

## 2021-10-21 ENCOUNTER — Inpatient Hospital Stay: Payer: BC Managed Care – PPO | Attending: Oncology

## 2021-10-21 VITALS — BP 127/86 | HR 60 | Temp 97.9°F | Resp 17 | Ht 67.0 in | Wt 239.8 lb

## 2021-10-21 DIAGNOSIS — C642 Malignant neoplasm of left kidney, except renal pelvis: Secondary | ICD-10-CM | POA: Diagnosis not present

## 2021-10-21 DIAGNOSIS — R944 Abnormal results of kidney function studies: Secondary | ICD-10-CM | POA: Diagnosis not present

## 2021-10-21 DIAGNOSIS — Z905 Acquired absence of kidney: Secondary | ICD-10-CM | POA: Diagnosis not present

## 2021-10-21 DIAGNOSIS — Z5112 Encounter for antineoplastic immunotherapy: Secondary | ICD-10-CM | POA: Insufficient documentation

## 2021-10-21 DIAGNOSIS — Z79899 Other long term (current) drug therapy: Secondary | ICD-10-CM | POA: Diagnosis not present

## 2021-10-21 LAB — CBC WITH DIFFERENTIAL (CANCER CENTER ONLY)
Abs Immature Granulocytes: 0.01 10*3/uL (ref 0.00–0.07)
Basophils Absolute: 0 10*3/uL (ref 0.0–0.1)
Basophils Relative: 1 %
Eosinophils Absolute: 0.4 10*3/uL (ref 0.0–0.5)
Eosinophils Relative: 8 %
HCT: 42.6 % (ref 39.0–52.0)
Hemoglobin: 15 g/dL (ref 13.0–17.0)
Immature Granulocytes: 0 %
Lymphocytes Relative: 21 %
Lymphs Abs: 1.2 10*3/uL (ref 0.7–4.0)
MCH: 30.9 pg (ref 26.0–34.0)
MCHC: 35.2 g/dL (ref 30.0–36.0)
MCV: 87.8 fL (ref 80.0–100.0)
Monocytes Absolute: 0.5 10*3/uL (ref 0.1–1.0)
Monocytes Relative: 10 %
Neutro Abs: 3.3 10*3/uL (ref 1.7–7.7)
Neutrophils Relative %: 60 %
Platelet Count: 260 10*3/uL (ref 150–400)
RBC: 4.85 MIL/uL (ref 4.22–5.81)
RDW: 11.9 % (ref 11.5–15.5)
WBC Count: 5.5 10*3/uL (ref 4.0–10.5)
nRBC: 0 % (ref 0.0–0.2)

## 2021-10-21 LAB — CMP (CANCER CENTER ONLY)
ALT: 47 U/L — ABNORMAL HIGH (ref 0–44)
AST: 28 U/L (ref 15–41)
Albumin: 4.1 g/dL (ref 3.5–5.0)
Alkaline Phosphatase: 93 U/L (ref 38–126)
Anion gap: 8 (ref 5–15)
BUN: 22 mg/dL — ABNORMAL HIGH (ref 6–20)
CO2: 21 mmol/L — ABNORMAL LOW (ref 22–32)
Calcium: 9.2 mg/dL (ref 8.9–10.3)
Chloride: 108 mmol/L (ref 98–111)
Creatinine: 1.13 mg/dL (ref 0.61–1.24)
GFR, Estimated: >60 mL/min (ref >60)
Glucose, Bld: 104 mg/dL — ABNORMAL HIGH (ref 70–99)
Potassium: 4.3 mmol/L (ref 3.5–5.1)
Sodium: 137 mmol/L (ref 135–145)
Total Bilirubin: 0.3 mg/dL (ref 0.3–1.2)
Total Protein: 7.9 g/dL (ref 6.5–8.1)

## 2021-10-21 LAB — TSH: TSH: 1.826 u[IU]/mL (ref 0.320–4.118)

## 2021-10-21 MED ORDER — SODIUM CHLORIDE 0.9 % IV SOLN
400.0000 mg | Freq: Once | INTRAVENOUS | Status: AC
  Administered 2021-10-21: 400 mg via INTRAVENOUS
  Filled 2021-10-21: qty 16

## 2021-10-21 MED ORDER — HEPARIN SOD (PORK) LOCK FLUSH 100 UNIT/ML IV SOLN: 500.0000 [IU] | Freq: Once | INTRAVENOUS | Status: DC | PRN

## 2021-10-21 MED ORDER — SODIUM CHLORIDE 0.9 % IV SOLN: Freq: Once | INTRAVENOUS | Status: AC

## 2021-10-21 MED ORDER — SODIUM CHLORIDE 0.9% FLUSH: 10.0000 mL | INTRAVENOUS | Status: DC | PRN

## 2021-10-21 NOTE — Progress Notes (Signed)
Hematology and Oncology Follow Up  Steve Jimenez [id number] 31-Mar-1981 41 y.o. 10/21/2021 8:11 AM College, [facility] @ [place], [place]      Principle Diagnosis: 41 year old man with kidney cancer diagnosed in July 2022.  He was found to have T2b left tumor with clear-cell histology and sarcomatoid features.    Prior Therapy:  He is status post robot-assisted laparoscopic left radical nephrectomy completed on April 03, 2021.  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  Margins are negative at that time with the final pathological staging T2BNX.  Current therapy: Pembrolizumab 400 mg every 6 weeks started on June 17, 2021.  He is here for cycle 3 of therapy which was delayed due to his travel.  Interim History: [name] returns today for a follow-up visit.  Since the last visit, he reports no major changes in his health.  He hospitalizations or illnesses.  He denies side effects related to Pembrolizumab.  He denies any skin rash or lesions.  He denies any shortness of breath or difficulty breathing.  He continues to be active and attends to activities of daily living.   Medications: Updated on review. Current Outpatient Medications  Medication Sig Dispense Refill   ALPRAZolam (XANAX) 1 MG tablet TAKE 1 TABLET BY MOUTH AT BEDTIME AS NEEDED FOR ANXIETY. 30 tablet 2   No current facility-administered medications for this visit.     Allergies:  Allergies  Allergen Reactions   Bee Venom Anaphylaxis   Aspirin     Other reaction(s): as a child   Physical exam:  Blood pressure 127/86, pulse 60, temperature 97.9 F (36.6 C), temperature source Axillary, resp. rate 17, height 5\' 7"  (1.702 m), weight 239 lb 12.8 oz (108.8 kg), SpO2 97 %.  ECOG 0  General appearance: Comfortable appearing without any discomfort Head: Normocephalic without any trauma Oropharynx: Mucous membranes are moist  and pink without any thrush or ulcers. Eyes: Pupils are equal and round reactive to light. Lymph nodes: No cervical, supraclavicular, inguinal or axillary lymphadenopathy.   Heart:regular rate and rhythm.  S1 and S2 without leg edema. Lung: Clear without any rhonchi or wheezes.  No dullness to percussion. Abdomin: Soft, nontender, nondistended with good bowel sounds.  No hepatosplenomegaly. Musculoskeletal: No joint deformity or effusion.  Full range of motion noted. Neurological: No deficits noted on motor, sensory and deep tendon reflex exam. Skin: No petechial rash or dryness.  Appeared moist.  Psychiatric: Mood and affect appeared appropriate.    Lab Results: Lab Results  Component Value Date   WBC 5.5 10/21/2021   HGB 15.0 10/21/2021   HCT 42.6 10/21/2021   MCV 87.8 10/21/2021   PLT 260 10/21/2021     Chemistry      Component Value Date/Time   NA 137 07/28/2021 0911   K 4.1 07/28/2021 0911   CL 108 07/28/2021 0911   CO2 22 07/28/2021 0911   BUN 15 07/28/2021 0911   CREATININE 1.40 (H) 09/03/2021 0737   CREATININE 1.05 07/28/2021 0911      Component Value Date/Time   CALCIUM 9.6 07/28/2021 0911   ALKPHOS 86 07/28/2021 0911   AST 32 07/28/2021 0911   ALT 44 07/28/2021 0911   BILITOT 0.6 07/28/2021 0911         Impression and Plan:  75 year old with:  1.  Kidney cancer diagnosed in July 2022.  He was found to have T2b left clear-cell renal cell carcinoma with sarcomatoid features.  Risks and benefits of continuing Pembrolizumab today were discussed.  He is agreeable to proceed at this time.  Long-term complications including autoimmune issues and GI toxicity among others were reviewed.  Plan is to update his staging scan in June 2023.  2.  IV access: Risks and benefits of continuing peripheral vein access were discussed.  He is agreeable to proceed without a need for Port-A-Cath.  3.  Antiemetics: Prescription for Compazine is available to him.  No nausea or  vomiting reported.  4.  Kidney function surveillance: His creatinine was slightly elevated on December 1 and will continue to monitor on future visits.  5.  Autoimmune considerations: We continue to educate him about potential complications including pneumonitis, colitis and thyroid disease.  He is not experiencing any at this time.    6.  Follow-up: In March 2023 for the next cycle of therapy.  30  minutes were dedicated to this visit. The time was spent on reviewing laboratory data, discussing complications related to his cancer and cancer therapy and future plan of care review.  Zola Button, MD 10/21/2021 8:11 AM

## 2021-10-21 NOTE — Patient Instructions (Signed)
[facility]  Discharge Instructions: Thank you for choosing [facility] to provide your oncology and hematology care.   If you have a lab appointment with the Cancer Center, please go directly to the Cancer Center and check in at the registration area.   Wear comfortable clothing and clothing appropriate for easy access to any Portacath or PICC line.   We strive to give you quality time with your provider. You may need to reschedule your appointment if you arrive late (15 or more minutes).  Arriving late affects you and other patients whose appointments are after yours.  Also, if you miss three or more appointments without notifying the office, you may be dismissed from the clinic at the provider's discretion.      For prescription refill requests, have your pharmacy contact our office and allow 72 hours for refills to be completed.    Today you received the following chemotherapy and/or immunotherapy agents Keytruda      To help prevent nausea and vomiting after your treatment, we encourage you to take your nausea medication as directed.  BELOW ARE SYMPTOMS THAT SHOULD BE REPORTED IMMEDIATELY: *FEVER GREATER THAN 100.4 F (38 C) OR HIGHER *CHILLS OR SWEATING *NAUSEA AND VOMITING THAT IS NOT CONTROLLED WITH YOUR NAUSEA MEDICATION *UNUSUAL SHORTNESS OF BREATH *UNUSUAL BRUISING OR BLEEDING *URINARY PROBLEMS (pain or burning when urinating, or frequent urination) *BOWEL PROBLEMS (unusual diarrhea, constipation, pain near the anus) TENDERNESS IN MOUTH AND THROAT WITH OR WITHOUT PRESENCE OF ULCERS (sore throat, sores in mouth, or a toothache) UNUSUAL RASH, SWELLING OR PAIN  UNUSUAL VAGINAL DISCHARGE OR ITCHING   Items with * indicate a potential emergency and should be followed up as soon as possible or go to the Emergency Department if any problems should occur.  Please show the CHEMOTHERAPY ALERT CARD or IMMUNOTHERAPY ALERT CARD at check-in to  the Emergency Department and triage nurse.  Should you have questions after your visit or need to cancel or reschedule your appointment, please contact [facility]  Dept: [phone]  and follow the prompts.  Office hours are 8:00 a.m. to 4:30 p.m. Monday - Friday. Please note that voicemails left after 4:00 p.m. may not be returned until the following business day.  We are closed weekends and major holidays. You have access to a nurse at all times for urgent questions. Please call the main number to the clinic Dept: [phone] and follow the prompts.   For any non-urgent questions, you may also contact your provider using MyChart. We now offer e-Visits for anyone 18 and older to request care online for non-urgent symptoms. For details visit [email or web address].   Also download the MyChart app! Go to the app store, search "MyChart", open the app, select [facility], and log in with your MyChart username and password.  Due to Covid, a mask is required upon entering the hospital/clinic. If you do not have a mask, one will be given to you upon arrival. For doctor visits, patients may have 1 support person aged 18 or older with them. For treatment visits, patients cannot have anyone with them due to current Covid guidelines and our immunocompromised population.   Pembrolizumab injection What is this medication? PEMBROLIZUMAB (pem broe liz ue mab) is a monoclonal antibody. It is used totreat certain types of cancer. This medicine may be used for other purposes; ask your health care provider orpharmacist if you have questions. COMMON BRAND NAME(S): Keytruda What should I   tell my care team before I take this medication? They need to know if you have any of these conditions: autoimmune diseases like Crohn's disease, ulcerative colitis, or lupus have had or planning to have an allogeneic stem cell transplant (uses someone else's stem cells) history of organ  transplant history of chest radiation nervous system problems like myasthenia gravis or Guillain-Barre syndrome an unusual or allergic reaction to pembrolizumab, other medicines, foods, dyes, or preservatives pregnant or trying to get pregnant breast-feeding How should I use this medication? This medicine is for infusion into a vein. It is given by a health careprofessional in a hospital or clinic setting. A special MedGuide will be given to you before each treatment. Be sure to readthis information carefully each time. Talk to your pediatrician regarding the use of this medicine in children. While this drug may be prescribed for children as young as 6 months for selectedconditions, precautions do apply. Overdosage: If you think you have taken too much of this medicine contact apoison control center or emergency room at once. NOTE: This medicine is only for you. Do not share this medicine with others. What if I miss a dose? It is important not to miss your dose. Call your doctor or health careprofessional if you are unable to keep an appointment. What may interact with this medication? Interactions have not been studied. This list may not describe all possible interactions. Give your health care provider a list of all the medicines, herbs, non-prescription drugs, or dietary supplements you use. Also tell them if you smoke, drink alcohol, or use illegaldrugs. Some items may interact with your medicine. What should I watch for while using this medication? Your condition will be monitored carefully while you are receiving thismedicine. You may need blood work done while you are taking this medicine. Do not become pregnant while taking this medicine or for 4 months after stopping it. Women should inform their doctor if they wish to become pregnant or think they might be pregnant. There is a potential for serious side effects to an unborn child. Talk to your health care professional or pharmacist for  more information. Do not breast-feed an infant while taking this medicine orfor 4 months after the last dose. What side effects may I notice from receiving this medication? Side effects that you should report to your doctor or health care professionalas soon as possible: allergic reactions like skin rash, itching or hives, swelling of the face, lips, or tongue bloody or black, tarry breathing problems changes in vision chest pain chills confusion constipation cough diarrhea dizziness or feeling faint or lightheaded fast or irregular heartbeat fever flushing joint pain low blood counts - this medicine may decrease the number of white blood cells, red blood cells and platelets. You may be at increased risk for infections and bleeding. muscle pain muscle weakness pain, tingling, numbness in the hands or feet persistent headache redness, blistering, peeling or loosening of the skin, including inside the mouth signs and symptoms of high blood sugar such as dizziness; dry mouth; dry skin; fruity breath; nausea; stomach pain; increased hunger or thirst; increased urination signs and symptoms of kidney injury like trouble passing urine or change in the amount of urine signs and symptoms of liver injury like dark urine, light-colored stools, loss of appetite, nausea, right upper belly pain, yellowing of the eyes or skin sweating swollen lymph nodes weight loss Side effects that usually do not require medical attention (report to yourdoctor or health care professional if they continue   or are bothersome): decreased appetite hair loss tiredness This list may not describe all possible side effects. Call your doctor for medical advice about side effects. You may report side effects to FDA at1-800-FDA-1088. Where should I keep my medication? This drug is given in a hospital or clinic and will not be stored at home. NOTE: This sheet is a summary. It may not cover all possible information. If you  have questions about this medicine, talk to your doctor, pharmacist, orhealth care provider.  2022 Elsevier/Gold Standard (2019-08-22 21:44:53)   

## 2021-11-30 ENCOUNTER — Ambulatory Visit: Payer: BC Managed Care – PPO | Admitting: Adult Health

## 2021-11-30 ENCOUNTER — Encounter: Payer: Self-pay | Admitting: Adult Health

## 2021-11-30 ENCOUNTER — Other Ambulatory Visit: Payer: Self-pay

## 2021-11-30 DIAGNOSIS — F411 Generalized anxiety disorder: Secondary | ICD-10-CM | POA: Diagnosis not present

## 2021-11-30 MED ORDER — ALPRAZOLAM 1 MG PO TABS: ORAL_TABLET | ORAL | 2 refills | Status: DC

## 2021-11-30 NOTE — Progress Notes (Signed)
Steve Jimenez [id number] 1981-05-28 41 y.o.  Subjective:   Patient ID:  Steve Jimenez is a 41 y.o. (DOB Sep 10, 1981) male.  Chief Complaint: No chief complaint on file.   HPI Steve Jimenez presents to the office today for follow-up of anxiety.  Describes mood today as "ok". Pleasant. Mood symptoms - denies depression and irritability. Reports increased anxiety at times. Denies panic attacks. Stating "I'm doing good". Getting immunotherapy every 42 days - getting scans every three months. Feels like Xanax continues to work well. Seeing therapist - Steve Jimenez. Stable interest and motivation. Taking medications as prescribed.  Energy levels stable. Active, does not have a regular exercise routine.  Enjoys some usuaIl interests and activities. Married. Lives with wife and 4 children. Parents local. Spending time with family. Appetite adequate. Weight stable - 220 pounds. Sleeps better some nights than others - not feeling rested - but not overly tired. Averages 4 to 6 hours during the week and 10 hours on the weekend. Reports occasional napping. Focus and concentration stable. Completing tasks. Managing aspects of household. 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 Denies SI or HI.  Denies AH or VH.  Previous medication trials: Xanax   Flowsheet Row ED from 08/03/2021 in [facility] [place] from 03/27/2021 in [place] CATEGORY Error: Question 6 not populated No Risk        Review of Systems:  Review of Systems  Musculoskeletal:  Negative for gait problem.  Neurological:  Negative for tremors.  Psychiatric/Behavioral:         Please refer to HPI   Medications: I have reviewed the patient's current medications.  Current Outpatient Medications  Medication Sig Dispense Refill   ALPRAZolam (XANAX) 1 MG tablet TAKE 1 TABLET BY  MOUTH AT BEDTIME AS NEEDED FOR ANXIETY. 30 tablet 2   No current facility-administered medications for this visit.    Medication Side Effects: None  Allergies:  Allergies  Allergen Reactions   Bee Venom Anaphylaxis    Other reaction(s): anaphylaxis   Aspirin     Other reaction(s): as a child    Past Medical History:  Diagnosis Date   Anginal pain ([place]) 10/2019   due to anxitey   Anxiety     Past Medical History, Surgical history, Social history, and Family history were reviewed and updated as appropriate.   Please see review of systems for further details on the patient's review from today.   Objective:   Physical Exam:  There were no vitals taken for this visit.  Physical Exam Constitutional:      General: He is not in acute distress. Musculoskeletal:        General: No deformity.  Neurological:     Mental Status: He is alert and oriented to person, place, and time.     Coordination: Coordination normal.  Psychiatric:        Attention and Perception: Attention and perception normal. He does not perceive auditory or visual hallucinations.        Mood and Affect: Mood normal. Mood is not anxious or depressed. Affect is not labile, blunt, angry or inappropriate.        Speech: Speech normal.        Behavior: Behavior normal.        Thought Content: Thought content normal. Thought content is not paranoid or delusional. Thought content does not include homicidal or suicidal  ideation. Thought content does not include homicidal or suicidal plan.        Cognition and Memory: Cognition and memory normal.        Judgment: Judgment normal.     Comments: Insight intact    Lab Review:     Component Value Date/Time   NA 137 10/21/2021 0800   K 4.3 10/21/2021 0800   CL 108 10/21/2021 0800   CO2 21 (L) 10/21/2021 0800   GLUCOSE 104 (H) 10/21/2021 0800   BUN 22 (H) 10/21/2021 0800   CREATININE 1.13 10/21/2021 0800   CALCIUM 9.2 10/21/2021 0800   PROT 7.9 10/21/2021 0800    ALBUMIN 4.1 10/21/2021 0800   AST 28 10/21/2021 0800   ALT 47 (H) 10/21/2021 0800   ALKPHOS 93 10/21/2021 0800   BILITOT 0.3 10/21/2021 0800   GFRNONAA >60 10/21/2021 0800       Component Value Date/Time   WBC 5.5 10/21/2021 0800   WBC 11.6 (H) 04/04/2021 0515   RBC 4.85 10/21/2021 0800   HGB 15.0 10/21/2021 0800   HCT 42.6 10/21/2021 0800   PLT 260 10/21/2021 0800   MCV 87.8 10/21/2021 0800   MCH 30.9 10/21/2021 0800   MCHC 35.2 10/21/2021 0800   RDW 11.9 10/21/2021 0800   LYMPHSABS 1.2 10/21/2021 0800   MONOABS 0.5 10/21/2021 0800   EOSABS 0.4 10/21/2021 0800   BASOSABS 0.0 10/21/2021 0800    No results found for: POCLITH, LITHIUM   No results found for: PHENYTOIN, PHENOBARB, VALPROATE, CBMZ   .res Assessment: Plan:    Plan:  PDMP reviewed  1. Increase Xanax 1mg  daily PRN anxiety  Read and reviewed note with patient for accuracy.   RTC 6 months - will call in 3 months for refills.  Patient advised to contact office with any questions, adverse effects, or acute worsening in signs and symptoms.  Discussed potential benefits, risk, and side effects of benzodiazepines to include potential risk of tolerance and dependence, as well as possible drowsiness.  Advised patient not to drive if experiencing drowsiness and to take lowest possible effective dose to minimize risk of dependence and tolerance.  Time spent with patient was 15 minutes.Greater than 50% of face to face time with patient was spent on counseling and coordination of care. We discussed anxiety and panic attacks. Discussed continuing to seeing his therapist.    Diagnoses and all orders for this visit:  Generalized anxiety disorder -     ALPRAZolam (XANAX) 1 MG tablet; TAKE 1 TABLET BY MOUTH AT BEDTIME AS NEEDED FOR ANXIETY.     Please see After Visit Summary for patient specific instructions.  Future Appointments  Date Time Provider [place]  12/16/2021  8:00 AM [facility] LAB  [facility] None  12/16/2021  8:30 AM Wyatt Portela, MD [facility] None  12/16/2021  9:30 AM [facility] INFUSION [facility] None  05/31/2022  8:00 AM Senovia Gauer, Berdie Ogren, NP [facility] None    No orders of the defined types were placed in this encounter.   -------------------------------

## 2021-12-04 ENCOUNTER — Ambulatory Visit: Payer: BC Managed Care – PPO | Admitting: Adult Health

## 2021-12-15 ENCOUNTER — Telehealth: Payer: Self-pay | Admitting: Oncology

## 2021-12-15 NOTE — Telephone Encounter (Signed)
Called patient regarding 03/15 appointment, patient is notified. ?

## 2021-12-16 ENCOUNTER — Inpatient Hospital Stay: Payer: BC Managed Care – PPO

## 2021-12-16 ENCOUNTER — Inpatient Hospital Stay: Payer: BC Managed Care – PPO | Attending: Oncology

## 2021-12-16 ENCOUNTER — Other Ambulatory Visit: Payer: Self-pay

## 2021-12-16 ENCOUNTER — Inpatient Hospital Stay (HOSPITAL_BASED_OUTPATIENT_CLINIC_OR_DEPARTMENT_OTHER): Payer: BC Managed Care – PPO | Admitting: Oncology

## 2021-12-16 VITALS — BP 120/87 | HR 55 | Temp 97.8°F | Resp 19 | Ht 67.0 in | Wt 242.7 lb

## 2021-12-16 DIAGNOSIS — Z5112 Encounter for antineoplastic immunotherapy: Secondary | ICD-10-CM | POA: Diagnosis not present

## 2021-12-16 DIAGNOSIS — Z79899 Other long term (current) drug therapy: Secondary | ICD-10-CM | POA: Diagnosis not present

## 2021-12-16 DIAGNOSIS — C642 Malignant neoplasm of left kidney, except renal pelvis: Secondary | ICD-10-CM

## 2021-12-16 DIAGNOSIS — Z905 Acquired absence of kidney: Secondary | ICD-10-CM | POA: Diagnosis not present

## 2021-12-16 LAB — CBC WITH DIFFERENTIAL (CANCER CENTER ONLY)
Abs Immature Granulocytes: 0.01 10*3/uL (ref 0.00–0.07)
Basophils Absolute: 0 10*3/uL (ref 0.0–0.1)
Basophils Relative: 1 %
Eosinophils Absolute: 0.5 10*3/uL (ref 0.0–0.5)
Eosinophils Relative: 10 %
HCT: 46.3 % (ref 39.0–52.0)
Hemoglobin: 16.1 g/dL (ref 13.0–17.0)
Immature Granulocytes: 0 %
Lymphocytes Relative: 26 %
Lymphs Abs: 1.3 10*3/uL (ref 0.7–4.0)
MCH: 30.4 pg (ref 26.0–34.0)
MCHC: 34.8 g/dL (ref 30.0–36.0)
MCV: 87.4 fL (ref 80.0–100.0)
Monocytes Absolute: 0.6 10*3/uL (ref 0.1–1.0)
Monocytes Relative: 11 %
Neutro Abs: 2.5 10*3/uL (ref 1.7–7.7)
Neutrophils Relative %: 52 %
Platelet Count: 284 10*3/uL (ref 150–400)
RBC: 5.3 MIL/uL (ref 4.22–5.81)
RDW: 11.7 % (ref 11.5–15.5)
WBC Count: 4.9 10*3/uL (ref 4.0–10.5)
nRBC: 0 % (ref 0.0–0.2)

## 2021-12-16 LAB — CMP (CANCER CENTER ONLY)
ALT: 58 U/L — ABNORMAL HIGH (ref 0–44)
AST: 38 U/L (ref 15–41)
Albumin: 4.3 g/dL (ref 3.5–5.0)
Alkaline Phosphatase: 97 U/L (ref 38–126)
Anion gap: 8 (ref 5–15)
BUN: 21 mg/dL — ABNORMAL HIGH (ref 6–20)
CO2: 21 mmol/L — ABNORMAL LOW (ref 22–32)
Calcium: 9.8 mg/dL (ref 8.9–10.3)
Chloride: 105 mmol/L (ref 98–111)
Creatinine: 1.19 mg/dL (ref 0.61–1.24)
GFR, Estimated: >60 mL/min (ref >60)
Glucose, Bld: 121 mg/dL — ABNORMAL HIGH (ref 70–99)
Potassium: 4.2 mmol/L (ref 3.5–5.1)
Sodium: 134 mmol/L — ABNORMAL LOW (ref 135–145)
Total Bilirubin: 0.4 mg/dL (ref 0.3–1.2)
Total Protein: 8.7 g/dL — ABNORMAL HIGH (ref 6.5–8.1)

## 2021-12-16 LAB — TSH: TSH: 1.761 u[IU]/mL (ref 0.320–4.118)

## 2021-12-16 MED ORDER — SODIUM CHLORIDE 0.9 % IV SOLN
400.0000 mg | Freq: Once | INTRAVENOUS | Status: AC
  Administered 2021-12-16: 400 mg via INTRAVENOUS
  Filled 2021-12-16: qty 16

## 2021-12-16 MED ORDER — SODIUM CHLORIDE 0.9 % IV SOLN: Freq: Once | INTRAVENOUS | Status: AC

## 2021-12-16 NOTE — Patient Instructions (Signed)
[facility]  Discharge Instructions: Thank you for choosing [facility] to provide your oncology and hematology care.   If you have a lab appointment with the Cancer Center, please go directly to the Cancer Center and check in at the registration area.   Wear comfortable clothing and clothing appropriate for easy access to any Portacath or PICC line.   We strive to give you quality time with your provider. You may need to reschedule your appointment if you arrive late (15 or more minutes).  Arriving late affects you and other patients whose appointments are after yours.  Also, if you miss three or more appointments without notifying the office, you may be dismissed from the clinic at the provider's discretion.      For prescription refill requests, have your pharmacy contact our office and allow 72 hours for refills to be completed.    Today you received the following chemotherapy and/or immunotherapy agents Keytruda      To help prevent nausea and vomiting after your treatment, we encourage you to take your nausea medication as directed.  BELOW ARE SYMPTOMS THAT SHOULD BE REPORTED IMMEDIATELY: *FEVER GREATER THAN 100.4 F (38 C) OR HIGHER *CHILLS OR SWEATING *NAUSEA AND VOMITING THAT IS NOT CONTROLLED WITH YOUR NAUSEA MEDICATION *UNUSUAL SHORTNESS OF BREATH *UNUSUAL BRUISING OR BLEEDING *URINARY PROBLEMS (pain or burning when urinating, or frequent urination) *BOWEL PROBLEMS (unusual diarrhea, constipation, pain near the anus) TENDERNESS IN MOUTH AND THROAT WITH OR WITHOUT PRESENCE OF ULCERS (sore throat, sores in mouth, or a toothache) UNUSUAL RASH, SWELLING OR PAIN  UNUSUAL VAGINAL DISCHARGE OR ITCHING   Items with * indicate a potential emergency and should be followed up as soon as possible or go to the Emergency Department if any problems should occur.  Please show the CHEMOTHERAPY ALERT CARD or IMMUNOTHERAPY ALERT CARD at check-in to  the Emergency Department and triage nurse.  Should you have questions after your visit or need to cancel or reschedule your appointment, please contact [facility]  Dept: [phone]  and follow the prompts.  Office hours are 8:00 a.m. to 4:30 p.m. Monday - Friday. Please note that voicemails left after 4:00 p.m. may not be returned until the following business day.  We are closed weekends and major holidays. You have access to a nurse at all times for urgent questions. Please call the main number to the clinic Dept: [phone] and follow the prompts.   For any non-urgent questions, you may also contact your provider using MyChart. We now offer e-Visits for anyone 18 and older to request care online for non-urgent symptoms. For details visit [email or web address].   Also download the MyChart app! Go to the app store, search "MyChart", open the app, select [facility], and log in with your MyChart username and password.  Due to Covid, a mask is required upon entering the hospital/clinic. If you do not have a mask, one will be given to you upon arrival. For doctor visits, patients may have 1 support person aged 18 or older with them. For treatment visits, patients cannot have anyone with them due to current Covid guidelines and our immunocompromised population.   Pembrolizumab injection What is this medication? PEMBROLIZUMAB (pem broe liz ue mab) is a monoclonal antibody. It is used totreat certain types of cancer. This medicine may be used for other purposes; ask your health care provider orpharmacist if you have questions. COMMON BRAND NAME(S): Keytruda What should I   tell my care team before I take this medication? They need to know if you have any of these conditions: autoimmune diseases like Crohn's disease, ulcerative colitis, or lupus have had or planning to have an allogeneic stem cell transplant (uses someone else's stem cells) history of organ  transplant history of chest radiation nervous system problems like myasthenia gravis or Guillain-Barre syndrome an unusual or allergic reaction to pembrolizumab, other medicines, foods, dyes, or preservatives pregnant or trying to get pregnant breast-feeding How should I use this medication? This medicine is for infusion into a vein. It is given by a health careprofessional in a hospital or clinic setting. A special MedGuide will be given to you before each treatment. Be sure to readthis information carefully each time. Talk to your pediatrician regarding the use of this medicine in children. While this drug may be prescribed for children as young as 6 months for selectedconditions, precautions do apply. Overdosage: If you think you have taken too much of this medicine contact apoison control center or emergency room at once. NOTE: This medicine is only for you. Do not share this medicine with others. What if I miss a dose? It is important not to miss your dose. Call your doctor or health careprofessional if you are unable to keep an appointment. What may interact with this medication? Interactions have not been studied. This list may not describe all possible interactions. Give your health care provider a list of all the medicines, herbs, non-prescription drugs, or dietary supplements you use. Also tell them if you smoke, drink alcohol, or use illegaldrugs. Some items may interact with your medicine. What should I watch for while using this medication? Your condition will be monitored carefully while you are receiving thismedicine. You may need blood work done while you are taking this medicine. Do not become pregnant while taking this medicine or for 4 months after stopping it. Women should inform their doctor if they wish to become pregnant or think they might be pregnant. There is a potential for serious side effects to an unborn child. Talk to your health care professional or pharmacist for  more information. Do not breast-feed an infant while taking this medicine orfor 4 months after the last dose. What side effects may I notice from receiving this medication? Side effects that you should report to your doctor or health care professionalas soon as possible: allergic reactions like skin rash, itching or hives, swelling of the face, lips, or tongue bloody or black, tarry breathing problems changes in vision chest pain chills confusion constipation cough diarrhea dizziness or feeling faint or lightheaded fast or irregular heartbeat fever flushing joint pain low blood counts - this medicine may decrease the number of white blood cells, red blood cells and platelets. You may be at increased risk for infections and bleeding. muscle pain muscle weakness pain, tingling, numbness in the hands or feet persistent headache redness, blistering, peeling or loosening of the skin, including inside the mouth signs and symptoms of high blood sugar such as dizziness; dry mouth; dry skin; fruity breath; nausea; stomach pain; increased hunger or thirst; increased urination signs and symptoms of kidney injury like trouble passing urine or change in the amount of urine signs and symptoms of liver injury like dark urine, light-colored stools, loss of appetite, nausea, right upper belly pain, yellowing of the eyes or skin sweating swollen lymph nodes weight loss Side effects that usually do not require medical attention (report to yourdoctor or health care professional if they continue   or are bothersome): decreased appetite hair loss tiredness This list may not describe all possible side effects. Call your doctor for medical advice about side effects. You may report side effects to FDA at1-800-FDA-1088. Where should I keep my medication? This drug is given in a hospital or clinic and will not be stored at home. NOTE: This sheet is a summary. It may not cover all possible information. If you  have questions about this medicine, talk to your doctor, pharmacist, orhealth care provider.  2022 Elsevier/Gold Standard (2019-08-22 21:44:53)   

## 2021-12-16 NOTE — Progress Notes (Signed)
Hematology and Oncology Follow Up ? ?Steve Jimenez ?[id number] ?1981-04-17 41 y.o. ?12/16/2021 8:14 AM ?[facility] [place] [facility] [place] [facility]  ? ? ? ? 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   ? ? ?Prior Therapy: ? ?He is status post robot-assisted laparoscopic left radical nephrectomy completed on April 03, 2021.  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  Margins are negative at that time with the final pathological staging T2BNX. ? ?Current therapy: Pembrolizumab 400 mg every 6 weeks started on June 17, 2021.  He is here for cycle 4 of therapy.  ? ?Interim History: Mr. Steve Jimenez presents today for repeat evaluation.  Since the last visit, he reports no major changes in his health.  He has tolerated the last cycle of therapy without any major complaints.  He has reported intermittent swelling in his ankle but has resolved at this time.  He denies any skin rashes, lesions or pruritus.  His performance status and quality of life remain excellent. ? ?Medications: Reviewed without changes. ?Current Outpatient Medications  ?Medication Sig Dispense Refill  ? ALPRAZolam (XANAX) 1 MG tablet TAKE 1 TABLET BY MOUTH AT BEDTIME AS NEEDED FOR ANXIETY. 30 tablet 2  ? ?No current facility-administered medications for this visit.  ? ? ? ?Allergies:  ?Allergies  ?Allergen Reactions  ? Bee Venom Anaphylaxis  ?  Other reaction(s): anaphylaxis  ? Aspirin   ?  Other reaction(s): as a child  ? ?Physical exam: ? ?Blood pressure 120/87, pulse (!) 55, temperature 97.8 ?F (36.6 ?C), temperature source Temporal, resp. rate 19, height '5\' 7"'$  (1.702 m), weight 242 lb 11.2 oz (110.1 kg), SpO2 100 %. ? ? ?ECOG 0 ? ?General appearance: Alert, awake without any distress. ?Head: Atraumatic without abnormalities ?Oropharynx: Without any thrush or ulcers. ?Eyes: No scleral icterus. ?Lymph nodes:  No lymphadenopathy noted in the cervical, supraclavicular, or axillary nodes ?Heart:regular rate and rhythm, without any murmurs or gallops.   ?Lung: Clear to auscultation without any rhonchi, wheezes or dullness to percussion. ?Abdomin: Soft, nontender without any shifting dullness or ascites. ?Musculoskeletal: No clubbing or cyanosis. ?Neurological: No motor or sensory deficits. ?Skin: No rashes or lesions. ? ? ? ? ?Lab Results: ?Lab Results  ?Component Value Date  ? WBC 5.5 10/21/2021  ? HGB 15.0 10/21/2021  ? HCT 42.6 10/21/2021  ? MCV 87.8 10/21/2021  ? PLT 260 10/21/2021  ? ?  Chemistry   ?   ?Component Value Date/Time  ? NA 137 10/21/2021 0800  ? K 4.3 10/21/2021 0800  ? CL 108 10/21/2021 0800  ? CO2 21 (L) 10/21/2021 0800  ? BUN 22 (H) 10/21/2021 0800  ? CREATININE 1.13 10/21/2021 0800  ?    ?Component Value Date/Time  ? CALCIUM 9.2 10/21/2021 0800  ? ALKPHOS 93 10/21/2021 0800  ? AST 28 10/21/2021 0800  ? ALT 47 (H) 10/21/2021 0800  ? BILITOT 0.3 10/21/2021 0800  ?  ? ? ? ? ? ?Impression and Plan: ? ?41 year old with: ? ?1.  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 ?  ?He is currently on Pembrolizumab without any major complications.  Risks and benefits of continuing this treatments to complete 1 year were discussed.  Immune mediated complications, GI toxicities among others were reiterated.  Alternative treatment options will be active surveillance with oral targeted therapy which will be deferred unless he has metastatic disease.  Plan is to update his staging scans in June 23. ? ?2.  IV access: Peripheral veins are currently in use without any issues. ? ?3.  Antiemetics: No nausea or vomiting reported at this time.  Compazine is available to him. ? ?4.  Kidney function surveillance: His creatinine clearance is within normal range at this time. ? ?5.  Autoimmune considerations: He has not experienced any at this time.  Complications including pneumonitis,  colitis and thyroid disease were reiterated. ?  ?6.  Follow-up: In 6 weeks for repeat follow-up. ? ?30  minutes were spent on this encounter.  The time was dedicated to reviewing disease status, treatment choices, addressing complications related to his cancer and cancer therapy. ? ?Zola Button, MD 12/16/2021 8:14 AM ? ?

## 2022-01-22 ENCOUNTER — Telehealth: Payer: Self-pay | Admitting: Oncology

## 2022-01-22 NOTE — Telephone Encounter (Signed)
Called patient regarding upcoming appointments, left  voicemail. ?

## 2022-01-27 ENCOUNTER — Other Ambulatory Visit: Payer: Self-pay

## 2022-01-27 ENCOUNTER — Inpatient Hospital Stay (HOSPITAL_BASED_OUTPATIENT_CLINIC_OR_DEPARTMENT_OTHER): Payer: BC Managed Care – PPO | Admitting: Oncology

## 2022-01-27 ENCOUNTER — Inpatient Hospital Stay: Payer: BC Managed Care – PPO

## 2022-01-27 ENCOUNTER — Inpatient Hospital Stay: Payer: BC Managed Care – PPO | Attending: Oncology

## 2022-01-27 VITALS — BP 126/69 | HR 55 | Temp 98.0°F | Resp 18 | Ht 67.0 in | Wt 249.5 lb

## 2022-01-27 DIAGNOSIS — C642 Malignant neoplasm of left kidney, except renal pelvis: Secondary | ICD-10-CM

## 2022-01-27 DIAGNOSIS — Z79899 Other long term (current) drug therapy: Secondary | ICD-10-CM | POA: Diagnosis not present

## 2022-01-27 DIAGNOSIS — Z5112 Encounter for antineoplastic immunotherapy: Secondary | ICD-10-CM | POA: Diagnosis not present

## 2022-01-27 DIAGNOSIS — Z905 Acquired absence of kidney: Secondary | ICD-10-CM | POA: Insufficient documentation

## 2022-01-27 LAB — CMP (CANCER CENTER ONLY)
ALT: 54 U/L — ABNORMAL HIGH (ref 0–44)
AST: 33 U/L (ref 15–41)
Albumin: 4 g/dL (ref 3.5–5.0)
Alkaline Phosphatase: 84 U/L (ref 38–126)
Anion gap: 7 (ref 5–15)
BUN: 20 mg/dL (ref 6–20)
CO2: 23 mmol/L (ref 22–32)
Calcium: 9.8 mg/dL (ref 8.9–10.3)
Chloride: 107 mmol/L (ref 98–111)
Creatinine: 1.1 mg/dL (ref 0.61–1.24)
GFR, Estimated: >60 mL/min (ref >60)
Glucose, Bld: 124 mg/dL — ABNORMAL HIGH (ref 70–99)
Potassium: 4.4 mmol/L (ref 3.5–5.1)
Sodium: 137 mmol/L (ref 135–145)
Total Bilirubin: 0.3 mg/dL (ref 0.3–1.2)
Total Protein: 7.8 g/dL (ref 6.5–8.1)

## 2022-01-27 LAB — CBC WITH DIFFERENTIAL (CANCER CENTER ONLY)
Abs Immature Granulocytes: 0.01 10*3/uL (ref 0.00–0.07)
Basophils Absolute: 0 10*3/uL (ref 0.0–0.1)
Basophils Relative: 1 %
Eosinophils Absolute: 0.7 10*3/uL — ABNORMAL HIGH (ref 0.0–0.5)
Eosinophils Relative: 14 %
HCT: 44.1 % (ref 39.0–52.0)
Hemoglobin: 15.2 g/dL (ref 13.0–17.0)
Immature Granulocytes: 0 %
Lymphocytes Relative: 26 %
Lymphs Abs: 1.3 10*3/uL (ref 0.7–4.0)
MCH: 30.7 pg (ref 26.0–34.0)
MCHC: 34.5 g/dL (ref 30.0–36.0)
MCV: 89.1 fL (ref 80.0–100.0)
Monocytes Absolute: 0.6 10*3/uL (ref 0.1–1.0)
Monocytes Relative: 12 %
Neutro Abs: 2.3 10*3/uL (ref 1.7–7.7)
Neutrophils Relative %: 47 %
Platelet Count: 254 10*3/uL (ref 150–400)
RBC: 4.95 MIL/uL (ref 4.22–5.81)
RDW: 11.7 % (ref 11.5–15.5)
WBC Count: 4.8 10*3/uL (ref 4.0–10.5)
nRBC: 0 % (ref 0.0–0.2)

## 2022-01-27 LAB — TSH: TSH: 1.558 u[IU]/mL (ref 0.350–4.500)

## 2022-01-27 MED ORDER — SODIUM CHLORIDE 0.9 % IV SOLN
400.0000 mg | Freq: Once | INTRAVENOUS | Status: AC
  Administered 2022-01-27: 400 mg via INTRAVENOUS
  Filled 2022-01-27: qty 16

## 2022-01-27 MED ORDER — SODIUM CHLORIDE 0.9 % IV SOLN: Freq: Once | INTRAVENOUS | Status: AC

## 2022-01-27 NOTE — Patient Instructions (Signed)
[facility]  Discharge Instructions: Thank you for choosing [facility] to provide your oncology and hematology care.   If you have a lab appointment with the Cancer Center, please go directly to the Cancer Center and check in at the registration area.   Wear comfortable clothing and clothing appropriate for easy access to any Portacath or PICC line.   We strive to give you quality time with your provider. You may need to reschedule your appointment if you arrive late (15 or more minutes).  Arriving late affects you and other patients whose appointments are after yours.  Also, if you miss three or more appointments without notifying the office, you may be dismissed from the clinic at the provider's discretion.      For prescription refill requests, have your pharmacy contact our office and allow 72 hours for refills to be completed.    Today you received the following chemotherapy and/or immunotherapy agents Keytruda      To help prevent nausea and vomiting after your treatment, we encourage you to take your nausea medication as directed.  BELOW ARE SYMPTOMS THAT SHOULD BE REPORTED IMMEDIATELY: *FEVER GREATER THAN 100.4 F (38 C) OR HIGHER *CHILLS OR SWEATING *NAUSEA AND VOMITING THAT IS NOT CONTROLLED WITH YOUR NAUSEA MEDICATION *UNUSUAL SHORTNESS OF BREATH *UNUSUAL BRUISING OR BLEEDING *URINARY PROBLEMS (pain or burning when urinating, or frequent urination) *BOWEL PROBLEMS (unusual diarrhea, constipation, pain near the anus) TENDERNESS IN MOUTH AND THROAT WITH OR WITHOUT PRESENCE OF ULCERS (sore throat, sores in mouth, or a toothache) UNUSUAL RASH, SWELLING OR PAIN  UNUSUAL VAGINAL DISCHARGE OR ITCHING   Items with * indicate a potential emergency and should be followed up as soon as possible or go to the Emergency Department if any problems should occur.  Please show the CHEMOTHERAPY ALERT CARD or IMMUNOTHERAPY ALERT CARD at check-in to  the Emergency Department and triage nurse.  Should you have questions after your visit or need to cancel or reschedule your appointment, please contact [facility]  Dept: [phone]  and follow the prompts.  Office hours are 8:00 a.m. to 4:30 p.m. Monday - Friday. Please note that voicemails left after 4:00 p.m. may not be returned until the following business day.  We are closed weekends and major holidays. You have access to a nurse at all times for urgent questions. Please call the main number to the clinic Dept: [phone] and follow the prompts.   For any non-urgent questions, you may also contact your provider using MyChart. We now offer e-Visits for anyone 18 and older to request care online for non-urgent symptoms. For details visit [email or web address].   Also download the MyChart app! Go to the app store, search "MyChart", open the app, select [facility], and log in with your MyChart username and password.  Due to Covid, a mask is required upon entering the hospital/clinic. If you do not have a mask, one will be given to you upon arrival. For doctor visits, patients may have 1 support person aged 18 or older with them. For treatment visits, patients cannot have anyone with them due to current Covid guidelines and our immunocompromised population.   Pembrolizumab injection What is this medication? PEMBROLIZUMAB (pem broe liz ue mab) is a monoclonal antibody. It is used totreat certain types of cancer. This medicine may be used for other purposes; ask your health care provider orpharmacist if you have questions. COMMON BRAND NAME(S): Keytruda What should I   tell my care team before I take this medication? They need to know if you have any of these conditions: autoimmune diseases like Crohn's disease, ulcerative colitis, or lupus have had or planning to have an allogeneic stem cell transplant (uses someone else's stem cells) history of organ  transplant history of chest radiation nervous system problems like myasthenia gravis or Guillain-Barre syndrome an unusual or allergic reaction to pembrolizumab, other medicines, foods, dyes, or preservatives pregnant or trying to get pregnant breast-feeding How should I use this medication? This medicine is for infusion into a vein. It is given by a health careprofessional in a hospital or clinic setting. A special MedGuide will be given to you before each treatment. Be sure to readthis information carefully each time. Talk to your pediatrician regarding the use of this medicine in children. While this drug may be prescribed for children as young as 6 months for selectedconditions, precautions do apply. Overdosage: If you think you have taken too much of this medicine contact apoison control center or emergency room at once. NOTE: This medicine is only for you. Do not share this medicine with others. What if I miss a dose? It is important not to miss your dose. Call your doctor or health careprofessional if you are unable to keep an appointment. What may interact with this medication? Interactions have not been studied. This list may not describe all possible interactions. Give your health care provider a list of all the medicines, herbs, non-prescription drugs, or dietary supplements you use. Also tell them if you smoke, drink alcohol, or use illegaldrugs. Some items may interact with your medicine. What should I watch for while using this medication? Your condition will be monitored carefully while you are receiving thismedicine. You may need blood work done while you are taking this medicine. Do not become pregnant while taking this medicine or for 4 months after stopping it. Women should inform their doctor if they wish to become pregnant or think they might be pregnant. There is a potential for serious side effects to an unborn child. Talk to your health care professional or pharmacist for  more information. Do not breast-feed an infant while taking this medicine orfor 4 months after the last dose. What side effects may I notice from receiving this medication? Side effects that you should report to your doctor or health care professionalas soon as possible: allergic reactions like skin rash, itching or hives, swelling of the face, lips, or tongue bloody or black, tarry breathing problems changes in vision chest pain chills confusion constipation cough diarrhea dizziness or feeling faint or lightheaded fast or irregular heartbeat fever flushing joint pain low blood counts - this medicine may decrease the number of white blood cells, red blood cells and platelets. You may be at increased risk for infections and bleeding. muscle pain muscle weakness pain, tingling, numbness in the hands or feet persistent headache redness, blistering, peeling or loosening of the skin, including inside the mouth signs and symptoms of high blood sugar such as dizziness; dry mouth; dry skin; fruity breath; nausea; stomach pain; increased hunger or thirst; increased urination signs and symptoms of kidney injury like trouble passing urine or change in the amount of urine signs and symptoms of liver injury like dark urine, light-colored stools, loss of appetite, nausea, right upper belly pain, yellowing of the eyes or skin sweating swollen lymph nodes weight loss Side effects that usually do not require medical attention (report to yourdoctor or health care professional if they continue   or are bothersome): decreased appetite hair loss tiredness This list may not describe all possible side effects. Call your doctor for medical advice about side effects. You may report side effects to FDA at1-800-FDA-1088. Where should I keep my medication? This drug is given in a hospital or clinic and will not be stored at home. NOTE: This sheet is a summary. It may not cover all possible information. If you  have questions about this medicine, talk to your doctor, pharmacist, orhealth care provider.  2022 Elsevier/Gold Standard (2019-08-22 21:44:53)   

## 2022-01-27 NOTE — Progress Notes (Signed)
Hematology and Oncology Follow Up ? ?Steve Jimenez [place] ?[id number] ?[birth year] 41 y.o. ?01/27/2022 8:20 AM ?[facility] [place] [facility] [employer]  ? ? ? ? ?Principle Diagnosis: 41 year old man with kidney cancer diagnosed in July 2022.  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 ? ? ?Prior Therapy: ? ?He is status post robot-assisted laparoscopic left radical nephrectomy completed on April 03, 2021.  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  Margins are negative at that time with the final pathological staging T2BNX. ? ?Current therapy: Pembrolizumab 400 mg every 6 weeks started on June 17, 2021.  He is here for cycle 5 of therapy.  ? ?Interim History: Steve Jimenez returns today for a follow-up visit.  Since the last visit, he reports no major changes in his health.  He denies any complications related to current therapy.  He has reported increased weight although his appetite and performance status and activity level are unchanged. ? ?Medications: Updated on review. ?Current Outpatient Medications  ?Medication Sig Dispense Refill  ? ALPRAZolam (XANAX) 1 MG tablet TAKE 1 TABLET BY MOUTH AT BEDTIME AS NEEDED FOR ANXIETY. 30 tablet 2  ? ?No current facility-administered medications for this visit.  ? ? ? ?Allergies:  ?Allergies  ?Allergen Reactions  ? Bee Venom Anaphylaxis  ?  Other reaction(s): anaphylaxis  ? Aspirin   ?  Other reaction(s): as a child  ? ?Physical exam: ? ?Blood pressure 126/69, pulse (!) 55, temperature 98 ?F (36.7 ?C), temperature source Temporal, resp. rate 18, height '5\' 7"'$  (1.702 m), weight 249 lb 8 oz (113.2 kg), SpO2 100 %. ? ? ?ECOG 0 ? ? ? ?General appearance: Comfortable appearing without any discomfort ?Head: Normocephalic without any trauma ?Oropharynx: Mucous membranes are moist and pink without any thrush or ulcers. ?Eyes: Pupils are equal and round reactive to light. ?Lymph nodes:  No cervical, supraclavicular, inguinal or axillary lymphadenopathy.   ?Heart:regular rate and rhythm.  S1 and S2 without leg edema. ?Lung: Clear without any rhonchi or wheezes.  No dullness to percussion. ?Abdomin: Soft, nontender, nondistended with good bowel sounds.  No hepatosplenomegaly. ?Musculoskeletal: No joint deformity or effusion.  Full range of motion noted. ?Neurological: No deficits noted on motor, sensory and deep tendon reflex exam. ?Skin: No petechial rash or dryness.  Appeared moist.  ? ? ? ? ?Lab Results: ?Lab Results  ?Component Value Date  ? WBC 4.8 01/27/2022  ? HGB 15.2 01/27/2022  ? HCT 44.1 01/27/2022  ? MCV 89.1 01/27/2022  ? PLT 254 01/27/2022  ? ?  Chemistry   ?   ?Component Value Date/Time  ? NA 134 (L) 12/16/2021 0803  ? K 4.2 12/16/2021 0803  ? CL 105 12/16/2021 0803  ? CO2 21 (L) 12/16/2021 0803  ? BUN 21 (H) 12/16/2021 0803  ? CREATININE 1.19 12/16/2021 0803  ?    ?Component Value Date/Time  ? CALCIUM 9.8 12/16/2021 0803  ? ALKPHOS 97 12/16/2021 0803  ? AST 38 12/16/2021 0803  ? ALT 58 (H) 12/16/2021 0803  ? BILITOT 0.4 12/16/2021 0803  ?  ? ? ? ? ? ?Impression and Plan: ? ?41 year old with: ? ?1.  Kidney cancer diagnosed in July 2022.  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 ?  ?The natural course of this disease was reviewed at this time and treatment choices were reiterated.  Risk of relapse was also discussed given his high risk sarcomatoid  features.  After discussion today he is agreeable to proceed with Pembrolizumab today and continued active surveillance after completing 1 year of therapy.  We will update his staging scans before the next visit.  Potential complication associated with Pembrolizumab were reviewed again.  These include GI toxicity as well as autoimmune complications were reviewed.   ? ?2.  IV access: No issues reported with his peripheral veins. ? ?3.  Antiemetics: Compazine is available to him without any nausea or  vomiting. ? ?4.  Kidney function surveillance: Creatinine clearance remains within normal range and we will continue to monitor. ? ?5.  Autoimmune considerations: We will continue to monitor this including hepatitis, pneumonitis and thyroid disease.  He is experiencing some weight gain which could be potentially autoimmune in nature. ?  ?6.  Follow-up: In 6 weeks for repeat follow-up. ? ?30  minutes were spent on this visit.  The time was dedicated to reviewing laboratory data, disease status update, treatment choices and addressing complication related to his cancer and cancer therapy. ? ?Zola Button, MD 01/27/2022 8:20 AM ? ?

## 2022-02-11 ENCOUNTER — Telehealth: Payer: Self-pay | Admitting: *Deleted

## 2022-02-11 NOTE — Telephone Encounter (Signed)
PC to patient, informed him his CT has been authorized by his insurance & he may schedule it at any time.  Central Scheduling number given, [id number].  He verbalizes understanding. ?

## 2022-02-22 ENCOUNTER — Ambulatory Visit (HOSPITAL_COMMUNITY)
Admission: RE | Admit: 2022-02-22 | Discharge: 2022-02-22 | Disposition: A | Discharge status: Home / Self Care | Payer: BC Managed Care – PPO | Source: Ambulatory Visit | Attending: Oncology | Admitting: Oncology

## 2022-02-22 ENCOUNTER — Other Ambulatory Visit (HOSPITAL_COMMUNITY): Payer: BC Managed Care – PPO

## 2022-02-22 DIAGNOSIS — C642 Malignant neoplasm of left kidney, except renal pelvis: Secondary | ICD-10-CM | POA: Insufficient documentation

## 2022-02-22 DIAGNOSIS — M19012 Primary osteoarthritis, left shoulder: Secondary | ICD-10-CM | POA: Diagnosis not present

## 2022-02-22 DIAGNOSIS — K760 Fatty (change of) liver, not elsewhere classified: Secondary | ICD-10-CM | POA: Diagnosis not present

## 2022-02-22 DIAGNOSIS — N280 Ischemia and infarction of kidney: Secondary | ICD-10-CM | POA: Diagnosis not present

## 2022-02-22 DIAGNOSIS — Z905 Acquired absence of kidney: Secondary | ICD-10-CM | POA: Diagnosis not present

## 2022-02-22 DIAGNOSIS — M19011 Primary osteoarthritis, right shoulder: Secondary | ICD-10-CM | POA: Diagnosis not present

## 2022-02-22 MED ORDER — IOHEXOL 300 MG/ML  SOLN
100.0000 mL | Freq: Once | INTRAMUSCULAR | Status: AC | PRN
  Administered 2022-02-22: 100 mL via INTRAVENOUS

## 2022-02-22 MED ORDER — SODIUM CHLORIDE (PF) 0.9 % IJ SOLN
INTRAMUSCULAR | Status: DC
Start: 2022-02-22 — End: 2022-02-23
  Filled 2022-02-22: qty 50

## 2022-03-02 ENCOUNTER — Other Ambulatory Visit: Payer: Self-pay | Admitting: Adult Health

## 2022-03-02 DIAGNOSIS — F411 Generalized anxiety disorder: Secondary | ICD-10-CM

## 2022-03-02 MED ORDER — ALPRAZOLAM 1 MG PO TABS: ORAL_TABLET | ORAL | 2 refills | Status: DC

## 2022-03-02 NOTE — Telephone Encounter (Signed)
Romeo called this morning at 9:25 to request refill of his alprazolam.  Appt  8/28.  Send to [facility] on [address].

## 2022-03-03 ENCOUNTER — Telehealth: Payer: Self-pay | Admitting: Oncology

## 2022-03-03 NOTE — Telephone Encounter (Signed)
Called patient regarding upcoming June appointments, left a voicemail. 

## 2022-03-10 ENCOUNTER — Inpatient Hospital Stay: Payer: BC Managed Care – PPO | Admitting: Oncology

## 2022-03-10 ENCOUNTER — Other Ambulatory Visit: Payer: Self-pay

## 2022-03-10 ENCOUNTER — Inpatient Hospital Stay: Payer: BC Managed Care – PPO | Attending: Oncology

## 2022-03-10 ENCOUNTER — Inpatient Hospital Stay: Payer: BC Managed Care – PPO

## 2022-03-10 VITALS — BP 129/92 | HR 50 | Temp 97.7°F | Resp 16 | Wt 238.4 lb

## 2022-03-10 DIAGNOSIS — C642 Malignant neoplasm of left kidney, except renal pelvis: Secondary | ICD-10-CM | POA: Diagnosis not present

## 2022-03-10 DIAGNOSIS — R42 Dizziness and giddiness: Secondary | ICD-10-CM | POA: Insufficient documentation

## 2022-03-10 DIAGNOSIS — R001 Bradycardia, unspecified: Secondary | ICD-10-CM | POA: Insufficient documentation

## 2022-03-10 DIAGNOSIS — Z79899 Other long term (current) drug therapy: Secondary | ICD-10-CM | POA: Diagnosis not present

## 2022-03-10 LAB — TSH: TSH: 2.055 u[IU]/mL (ref 0.350–4.500)

## 2022-03-10 LAB — CBC WITH DIFFERENTIAL (CANCER CENTER ONLY)
Abs Immature Granulocytes: 0.01 10*3/uL (ref 0.00–0.07)
Basophils Absolute: 0 10*3/uL (ref 0.0–0.1)
Basophils Relative: 1 %
Eosinophils Absolute: 0.3 10*3/uL (ref 0.0–0.5)
Eosinophils Relative: 6 %
HCT: 44.5 % (ref 39.0–52.0)
Hemoglobin: 15.8 g/dL (ref 13.0–17.0)
Immature Granulocytes: 0 %
Lymphocytes Relative: 24 %
Lymphs Abs: 1.2 10*3/uL (ref 0.7–4.0)
MCH: 31.2 pg (ref 26.0–34.0)
MCHC: 35.5 g/dL (ref 30.0–36.0)
MCV: 87.8 fL (ref 80.0–100.0)
Monocytes Absolute: 0.4 10*3/uL (ref 0.1–1.0)
Monocytes Relative: 8 %
Neutro Abs: 3 10*3/uL (ref 1.7–7.7)
Neutrophils Relative %: 61 %
Platelet Count: 258 10*3/uL (ref 150–400)
RBC: 5.07 MIL/uL (ref 4.22–5.81)
RDW: 11.9 % (ref 11.5–15.5)
WBC Count: 4.9 10*3/uL (ref 4.0–10.5)
nRBC: 0 % (ref 0.0–0.2)

## 2022-03-10 LAB — CMP (CANCER CENTER ONLY)
ALT: 57 U/L — ABNORMAL HIGH (ref 0–44)
AST: 38 U/L (ref 15–41)
Albumin: 4.5 g/dL (ref 3.5–5.0)
Alkaline Phosphatase: 83 U/L (ref 38–126)
Anion gap: 6 (ref 5–15)
BUN: 17 mg/dL (ref 6–20)
CO2: 22 mmol/L (ref 22–32)
Calcium: 10.2 mg/dL (ref 8.9–10.3)
Chloride: 107 mmol/L (ref 98–111)
Creatinine: 1.07 mg/dL (ref 0.61–1.24)
GFR, Estimated: >60 mL/min (ref >60)
Glucose, Bld: 100 mg/dL — ABNORMAL HIGH (ref 70–99)
Potassium: 4.1 mmol/L (ref 3.5–5.1)
Sodium: 135 mmol/L (ref 135–145)
Total Bilirubin: 0.4 mg/dL (ref 0.3–1.2)
Total Protein: 8.6 g/dL — ABNORMAL HIGH (ref 6.5–8.1)

## 2022-03-10 NOTE — Progress Notes (Signed)
Hematology and Oncology Follow Up  Steve Jimenez [id number] 1981/07/25 41 y.o. 03/10/2022 11:31 AM [facility] [employer]      Principle Diagnosis: 41 year old man with T2b clear-cell renal cell carcinoma diagnosed in July 2022.   Prior Therapy:  He is status post robot-assisted laparoscopic left radical nephrectomy completed on April 03, 2021.  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  Margins are negative at that time with the final pathological staging T2BNX.  Current therapy: Pembrolizumab 400 mg every 6 weeks started on June 17, 2021.  He is here for cycle 6 of therapy.   Interim History: Steve Jimenez presents today for return evaluation.  Since the last visit, he reports he has been feeling well without any complaints.  However, after having his blood taken out today he had felt lightheaded and slightly faint.  He denies any chest pain, shortness of breath or difficulty breathing.  She denies any palpitation.  She does not take any medication or did not take any Xanax today.  Medications: Reviewed without changes. Current Outpatient Medications  Medication Sig Dispense Refill   ALPRAZolam (XANAX) 1 MG tablet TAKE 1 TABLET BY MOUTH AT BEDTIME AS NEEDED FOR ANXIETY. 30 tablet 2   No current facility-administered medications for this visit.     Allergies:  Allergies  Allergen Reactions   Bee Venom Anaphylaxis    Other reaction(s): anaphylaxis   Aspirin     Other reaction(s): as a child   Physical exam:  Blood pressure (!) 129/92, pulse (!) 50, temperature 97.7 F (36.5 C), temperature source Temporal, resp. rate 16, weight 238 lb 6.4 oz (108.1 kg), SpO2 98 %.   ECOG 0    General appearance: Alert, awake without any distress. Head: Atraumatic without abnormalities Oropharynx: Without any thrush or ulcers. Eyes: No scleral icterus. Lymph nodes: No  lymphadenopathy noted in the cervical, supraclavicular, or axillary nodes Heart:regular rate and rhythm, without any murmurs or gallops.   Lung: Clear to auscultation without any rhonchi, wheezes or dullness to percussion. Abdomin: Soft, nontender without any shifting dullness or ascites. Musculoskeletal: No clubbing or cyanosis. Neurological: No motor or sensory deficits. Skin: No rashes or lesions.      Lab Results: Lab Results  Component Value Date   WBC 4.9 03/10/2022   HGB 15.8 03/10/2022   HCT 44.5 03/10/2022   MCV 87.8 03/10/2022   PLT 258 03/10/2022     Chemistry      Component Value Date/Time   NA 137 01/27/2022 0802   K 4.4 01/27/2022 0802   CL 107 01/27/2022 0802   CO2 23 01/27/2022 0802   BUN 20 01/27/2022 0802   CREATININE 1.10 01/27/2022 0802      Component Value Date/Time   CALCIUM 9.8 01/27/2022 0802   ALKPHOS 84 01/27/2022 0802   AST 33 01/27/2022 0802   ALT 54 (H) 01/27/2022 0802   BILITOT 0.3 01/27/2022 0802         Impression and Plan:  41 year old with:  1.   T2b left kidney cancer diagnosed in 2022.  He was found to have clear-cell tumor with sarcomatoid features after surgical resection.   He is currently on adjuvant Pembrolizumab which she has tolerated very well.  CT scan obtained on Feb 22, 2022 was personally reviewed and discussed with the patient which showed no evidence of metastatic disease.  Risks and benefits of proceeding with treatment today were discussed.  He  is agreeable to proceed but feels slightly faint and might consider delaying treatment at this time.  He will be reevaluated in the next 30 minutes.  If he is still bradycardic and lightheaded, the treatment will be delayed.  The duration of therapy was discussed and recommended total 7 cycles.  2.  IV access: Peripheral veins are currently in use without any issues.  3.  Antiemetics: No nausea or vomiting reported at this time.  4.  Bradycardia and lightheadedness:  Unclear etiology.  Could be related to phlebotomy and vasovagal response.  Referral to cardiology could be considered if this issue persists.  I do not believe this is related to his immunotherapy.  5.  Autoimmune considerations: I continue to educate him about potential complication clued pneumonitis, colitis and thyroid disease.  He has not experienced any at this time.   6.  Follow-up: He will return in 6 weeks for follow-up visit.  30  minutes were dedicated to this encounter.  Time spent on reviewing imaging studies, treatment choices and addressing complications related to his cancer and cancer therapy.  Zola Button, MD 03/10/2022 11:31 AM

## 2022-03-13 NOTE — Progress Notes (Unsigned)
Cardiology Clinic Note   Patient Name: Steve Jimenez Date of Encounter: 03/15/2022  Primary Care Provider:  [facility] [place] Primary Cardiologist:  Quay Burow, MD  Patient Profile    Steve Jimenez 41 year old male presents to the clinic today for follow-up evaluation of his hyperlipidemia and atypical chest pain.    Past Medical History    Past Medical History:  Diagnosis Date   Anginal pain ([place]) 10/2019   due to anxitey   Anxiety    Past Surgical History:  Procedure Laterality Date   APPENDECTOMY     age 69   ROBOT ASSISTED LAPAROSCOPIC NEPHRECTOMY Left 04/03/2021   Procedure: XI ROBOTIC ASSISTED LAPAROSCOPIC NEPHRECTOMY;  Surgeon: Ceasar Mons, MD;  Location: [facility];  Service: Urology;  Laterality: Left;    Allergies  Allergies  Allergen Reactions   Bee Venom Anaphylaxis    Other reaction(s): anaphylaxis   Aspirin     Other reaction(s): as a child    History of Present Illness    Steve Jimenez has a PMH of hyperlipidemia and atypical chest pain.  He also underwent appendectomy.  He also previously reported chest discomfort that was felt to be atypical in nature.  His EKG was found to be normal.  His calcium scoring was 0.  He was seen virtually by Kerin Ransom, PA-C on 02/01/2020.  During that time he reported that he was taking his atorvastatin every other day due to side effects that he noted.  He denied recurrent episodes of chest discomfort.  He presents to the clinic today for follow-up evaluation states during a blood draw last week at the oncologist he noted lightheadedness and heart rate in the 40s.  He was concerned and scheduled follow-up with cardiology.  He reports that his heart rate typically runs in the mid 50s.  His blood pressure has been well controlled.  We reviewed his previous calcium scoring.  We reviewed his elevated cholesterol.  He did not tolerate atorvastatin and discontinued after  about 1 month of therapy.  He wishes to start on different statin therapy but would like to wait until after his immunotherapy is done on 06/1922.  He is riding a bicycle 3 times per week for about 5-10 miles.  He expressed understanding.  I will order a cardiac event monitor, have him maintain his current physical activity  Today he denies chest pain, shortness of breath, lower extremity edema, fatigue, palpitations, melena, hematuria, hemoptysis, diaphoresis, weakness, presyncope, syncope, orthopnea, and PND.  Home Medications    Prior to Admission medications   Medication Sig Start Date End Date Taking? Authorizing Provider  ALPRAZolam (XANAX) 1 MG tablet TAKE 1 TABLET BY MOUTH AT BEDTIME AS NEEDED FOR ANXIETY. 03/02/22   Cottle, Billey Co., MD    Family History    Family History  Problem Relation Age of Onset   Hypertension Mother    Healthy Father    He indicated that his mother is alive. He indicated that his father is alive. He indicated that his sister is alive. He indicated that his brother is alive.  Social History    Social History   Socioeconomic History   Marital status: Married    Spouse name: Not on file   Number of children: Not on file   Years of education: Not on file   Highest education level: Not on file  Occupational History   Not on file  Tobacco Use   Smoking status: Former  Years: 10.00    Types: Cigarettes    Quit date: 2004    Years since quitting: 19.4   Smokeless tobacco: Never  Vaping Use   Vaping Use: Never used  Substance and Sexual Activity   Alcohol use: Yes    Alcohol/week: 6.0 standard drinks of alcohol    Types: 6 Standard drinks or equivalent per week    Comment: socially   Drug use: Never   Sexual activity: Not on file  Other Topics Concern   Not on file  Social History Narrative   Not on file   Social Determinants of Health   Financial Resource Strain: Not on file  Food Insecurity: Not on file  Transportation Needs: Not  on file  Physical Activity: Not on file  Stress: Not on file  Social Connections: Not on file  Intimate Partner Violence: Not on file     Review of Systems    General:  No chills, fever, night sweats or weight changes.  Cardiovascular:  No chest pain, dyspnea on exertion, edema, orthopnea, palpitations, paroxysmal nocturnal dyspnea. Dermatological: No rash, lesions/masses Respiratory: No cough, dyspnea Urologic: No hematuria, dysuria Abdominal:   No nausea, vomiting, diarrhea, bright red blood per rectum, melena, or hematemesis Neurologic:  No visual changes, wkns, changes in mental status. All other systems reviewed and are otherwise negative except as noted above.  Physical Exam    VS:  BP 132/78   Pulse (!) 54   Ht '5\' 7"'$  (1.702 m)   Wt 240 lb (108.9 kg)   SpO2 96%   BMI 37.59 kg/m  , BMI Body mass index is 37.59 kg/m. GEN: Well nourished, well developed, in no acute distress. HEENT: normal. Neck: Supple, no JVD, carotid bruits, or masses. Cardiac: RRR, no murmurs, rubs, or gallops. No clubbing, cyanosis, edema.  Radials/DP/PT 2+ and equal bilaterally.  Respiratory:  Respirations regular and unlabored, clear to auscultation bilaterally. GI: Soft, nontender, nondistended, BS + x 4. MS: no deformity or atrophy. Skin: warm and dry, no rash. Neuro:  Strength and sensation are intact. Psych: Normal affect.  Accessory Clinical Findings    Recent Labs: 03/10/2022: ALT 57; BUN 17; Creatinine 1.07; Hemoglobin 15.8; Platelet Count 258; Potassium 4.1; Sodium 135; TSH 2.055   Recent Lipid Panel No results found for: "CHOL", "TRIG", "HDL", "CHOLHDL", "VLDL", "LDLCALC", "LDLDIRECT"  ECG personally reviewed by me today-sinus bradycardia 54 bpm- No acute changes  CT cardiac scoring 08/02/2019  EXAM: Coronary Calcium Score   TECHNIQUE: The patient was scanned on a [employer]. Axial non-contrast 3 mm slices were carried out through the heart. The data set was  analyzed on a dedicated work station and scored using the Patrick.   FINDINGS: Non-cardiac: See separate report from [facility].   Ascending Aorta: Normal size, no calcifications.   Pericardium: Normal.   Coronary arteries: Normal origin.   IMPRESSION: Coronary calcium score of 0. This was 0 percentile for age and sex matched control.     Electronically Signed   By: Ena Dawley   On: 08/02/2019 16:03 Assessment & Plan   1.  Bradycardia, presyncope-during a blood draw last week he noted lightheadedness and heart rate in the low to mid 40s.  Average heart rate in the 50s.  Recent lab work unremarkable. 7-day cardiac event monitor  Chest discomfort-no recent episodes of arm neck back or chest discomfort.  Initially referred to cardiology for evaluation of chest pain.  CT cardiac scoring 08/02/2019 showed coronary calcium score of  0. Reviewed CT  Hyperlipidemia-LDL 143 on 12/21/18.  Plan to restart statin therapy after he completes immunotherapy 06/22/2022. Heart healthy low-sodium high-fiber diet.   Increase physical activity as tolerated Repeat fasting lipids at follow-up in 6-8 weeks  Disposition: Follow-up with Dr. Gwenlyn Found or me 8 weeks.  Jossie Ng. Daniyal Tabor NP-C    03/15/2022, 9:04 AM [address] Office [phone] Fax [phone]  Notice: This dictation was prepared with Dragon dictation along with smaller phrase technology. Any transcriptional errors that result from this process are unintentional and may not be corrected upon review.  I spent 14 minutes examining this patient, reviewing medications, and using patient centered shared decision making involving her cardiac care.  Prior to her visit I spent greater than 20 minutes reviewing her past medical history,  medications, and prior cardiac tests.

## 2022-03-15 ENCOUNTER — Ambulatory Visit: Payer: BC Managed Care – PPO | Admitting: General Practice

## 2022-03-15 ENCOUNTER — Encounter: Payer: Self-pay | Admitting: General Practice

## 2022-03-15 ENCOUNTER — Ambulatory Visit (INDEPENDENT_AMBULATORY_CARE_PROVIDER_SITE_OTHER): Payer: BC Managed Care – PPO

## 2022-03-15 VITALS — BP 132/78 | HR 54 | Ht 67.0 in | Wt 240.0 lb

## 2022-03-15 DIAGNOSIS — R001 Bradycardia, unspecified: Secondary | ICD-10-CM | POA: Diagnosis not present

## 2022-03-15 DIAGNOSIS — R0789 Other chest pain: Secondary | ICD-10-CM | POA: Diagnosis not present

## 2022-03-15 DIAGNOSIS — E782 Mixed hyperlipidemia: Secondary | ICD-10-CM | POA: Diagnosis not present

## 2022-03-15 DIAGNOSIS — R55 Syncope and collapse: Secondary | ICD-10-CM

## 2022-03-15 DIAGNOSIS — R42 Dizziness and giddiness: Secondary | ICD-10-CM | POA: Diagnosis not present

## 2022-03-15 NOTE — Patient Instructions (Signed)
Medication Instructions:  The current medical regimen is effective;  continue present plan and medications as directed. Please refer to the Current Medication list given to you today.   *If you need a refill on your cardiac medications before your next appointment, please call your pharmacy*  Lab Work:    NONE     If you have labs (blood work) drawn today and your tests are completely normal, you will receive your results only by: [place] (if you have MyChart) OR  A paper copy in the mail If you have any lab test that is abnormal or we need to change your treatment, we will call you to review the results.  Testing/Procedures:  Your physician has recommended that you wear an event monitor-7. This will be mailed to you.  Event monitors are medical devices that record the heart's electrical activity.The monitor is a small, portable device. You can wear one while you do your normal daily activities. This is usually used to diagnose what is causing palpitations/syncope (passing out).  Follow-Up: Your next appointment:  7-8 week(s) In Person with Quay Burow, MD  or Coletta Memos, FNP       At [facility], you and your health needs are our priority.  As part of our continuing mission to provide you with exceptional heart care, we have created designated Provider Care Teams.  These Care Teams include your primary Cardiologist (physician) and Advanced Practice Providers (APPs -  Physician Assistants and Nurse Practitioners) who all work together to provide you with the care you need, when you need it.    Important Information About Sugar       ZIO XT- Long Term Monitor Instructions  Your physician has requested you wear a ZIO patch monitor for 14 days.  This is a single patch monitor. [employer] supplies one patch monitor per enrollment. Additional stickers are not available. Please do not apply patch if you will be having a Nuclear Stress Test,  Echocardiogram, Cardiac CT, MRI,  or Chest Xray during the period you would be wearing the  monitor. The patch cannot be worn during these tests. You cannot remove and re-apply the  ZIO XT patch monitor.  Your ZIO patch monitor will be mailed 3 day USPS to your address on file. It may take 3-5 days  to receive your monitor after you have been enrolled.  Once you have received your monitor, please review the enclosed instructions. Your monitor  has already been registered assigning a specific monitor serial # to you.  Billing and Patient Assistance Program Information  We have supplied [employer] with any of your insurance information on file for billing purposes. [employer] offers a sliding scale Patient Assistance Program for patients that do not have  insurance, or whose insurance does not completely cover the cost of the ZIO monitor.  You must apply for the Patient Assistance Program to qualify for this discounted rate.  To apply, please call [employer] at [phone], select option 4, select option 2, ask to apply for  Patient Assistance Program. Theodore Demark will ask your household income, and how many people  are in your household. They will quote your out-of-pocket cost based on that information.  [employer] will also be able to set up a 21-month interest-free payment plan if needed.  Applying the monitor   Shave hair from upper left chest.  Hold abrader disc by orange tab. Rub abrader in 40 strokes over the upper left chest as  indicated in your monitor instructions.  Clean area with 4 enclosed alcohol pads. Let dry.  Apply patch as indicated in monitor instructions. Patch will be placed under collarbone on left  side of chest with arrow pointing upward.  Rub patch adhesive wings for 2 minutes. Remove white label marked "1". Remove the white  label marked "2". Rub patch adhesive wings for 2 additional minutes.  While looking in a mirror, press and release button in center of patch. A small green light will  flash 3-4 times.  This will be your only indicator that the monitor has been turned on.  Do not shower for the first 24 hours. You may shower after the first 24 hours.  Press the button if you feel a symptom. You will hear a small click. Record Date, Time and  Symptom in the Patient Logbook.  When you are ready to remove the patch, follow instructions on the last 2 pages of Patient  Logbook. Stick patch monitor onto the last page of Patient Logbook.  Place Patient Logbook in the blue and white box. Use locking tab on box and tape box closed  securely. The blue and white box has prepaid postage on it. Please place it in the mailbox as  soon as possible. Your physician should have your test results approximately 7 days after the  monitor has been mailed back to [facility].  Call [employer] at [phone] if you have questions regarding  your ZIO XT patch monitor. Call them immediately if you see an orange light blinking on your  monitor.  If your monitor falls off in less than 4 days, contact our Monitor department at [phone].  If your monitor becomes loose or falls off after 4 days call [employer] at [phone] for  suggestions on securing your monitor

## 2022-03-15 NOTE — Progress Notes (Unsigned)
Enrolled for [employer] to mail a ZIO XT long term holter monitor to the patients address on file.   Dr. Berry to read. 

## 2022-03-17 ENCOUNTER — Telehealth: Payer: Self-pay | Admitting: Oncology

## 2022-03-17 NOTE — Telephone Encounter (Signed)
Called patient regarding upcoming July appointments, left a voicemail.

## 2022-03-18 DIAGNOSIS — R001 Bradycardia, unspecified: Secondary | ICD-10-CM

## 2022-03-18 DIAGNOSIS — R55 Syncope and collapse: Secondary | ICD-10-CM | POA: Diagnosis not present

## 2022-03-30 DIAGNOSIS — R55 Syncope and collapse: Secondary | ICD-10-CM | POA: Diagnosis not present

## 2022-03-30 DIAGNOSIS — R001 Bradycardia, unspecified: Secondary | ICD-10-CM | POA: Diagnosis not present

## 2022-04-21 ENCOUNTER — Telehealth: Payer: Self-pay | Admitting: *Deleted

## 2022-04-21 ENCOUNTER — Inpatient Hospital Stay: Payer: BC Managed Care – PPO | Attending: Oncology | Admitting: Oncology

## 2022-04-21 ENCOUNTER — Inpatient Hospital Stay: Payer: BC Managed Care – PPO

## 2022-04-21 NOTE — Telephone Encounter (Signed)
PC to patient regarding missed appointments this a.m., no answer, left VM - informed patient our scheduling department will contact him to reschedule.  Instructed patient to call this office with any questions/concerns, [phone]. Scheduling message sent.

## 2022-04-22 ENCOUNTER — Telehealth: Payer: Self-pay | Admitting: Oncology

## 2022-04-22 NOTE — Telephone Encounter (Signed)
.  Called patient to schedule appointment per 7/2, left pt msg with instructions to call in sooner if he would like earlier appts

## 2022-04-24 ENCOUNTER — Ambulatory Visit (HOSPITAL_COMMUNITY): Payer: BC Managed Care – PPO

## 2022-04-26 ENCOUNTER — Other Ambulatory Visit: Payer: Self-pay

## 2022-05-07 NOTE — Progress Notes (Deleted)
Cardiology Clinic Note   Patient Name: Steve Jimenez Date of Encounter: 05/07/2022  Primary Care Provider:  [facility] [place] Primary Cardiologist:  Quay Burow, MD  Patient Profile    [place] 41 year old male presents to the clinic today for follow-up evaluation of his hyperlipidemia and atypical chest pain.  Past Medical History    Past Medical History:  Diagnosis Date   Anginal pain ([place]) 10/2019   due to anxitey   Anxiety    Past Surgical History:  Procedure Laterality Date   APPENDECTOMY     age 73   ROBOT ASSISTED LAPAROSCOPIC NEPHRECTOMY Left 04/03/2021   Procedure: XI ROBOTIC ASSISTED LAPAROSCOPIC NEPHRECTOMY;  Surgeon: Ceasar Mons, MD;  Location: [facility];  Service: Urology;  Laterality: Left;    Allergies  Allergies  Allergen Reactions   Bee Venom Anaphylaxis    Other reaction(s): anaphylaxis   Aspirin     Other reaction(s): as a child    History of Present Illness    Steve Jimenez has a PMH of hyperlipidemia and atypical chest pain.  He also underwent appendectomy.   He also previously reported chest discomfort that was felt to be atypical in nature.  His EKG was found to be normal.  His calcium scoring was 0.   He was seen virtually by Kerin Ransom, PA-C on 02/01/2020.  During that time he reported that he was taking his atorvastatin every other day due to side effects that he noted.  He denied recurrent episodes of chest discomfort.   He presented to the clinic 03/15/22 for follow-up evaluation stated during a blood draw last week at the oncologist he noted lightheadedness and heart rate in the 40s.  He was concerned and scheduled follow-up with cardiology.  He reported that his heart rate typically rans in the mid 50s.  His blood pressure has been well controlled.  We reviewed his previous calcium scoring.  We reviewed his elevated cholesterol.  He did not tolerate atorvastatin and discontinued  after about 1 month of therapy.  He wishes to start on different statin therapy but would like to wait until after his immunotherapy is done on 06/1922.  He is riding a bicycle 3 times per week for about 5-10 miles.  He expressed understanding.  I will order a cardiac event monitor, have him maintain his current physical activity   Today he denies chest pain, shortness of breath, lower extremity edema, fatigue, palpitations, melena, hematuria, hemoptysis, diaphoresis, weakness, presyncope, syncope, orthopnea, and PND.   Bradycardia, presyncope-during a blood draw last week he noted lightheadedness and heart rate in the low to mid 40s.  Average heart rate in the 50s.  Recent lab work unremarkable.  Cardiac event monitor showed sinus rhythm/sinus bradycardia and rare PACs/PVCs. Maintain p.o. hydration Avoid AV nodal blocking agents   Chest discomfort-no chest pain today.  Initially referred to cardiology for evaluation of chest pain.  CT cardiac scoring 08/02/2019 showed coronary calcium score of 0. No plans for ischemic evaluation   Hyperlipidemia-LDL 143 on 12/21/18.  Plan to restart statin therapy after he completes immunotherapy 06/22/2022. Heart healthy low-sodium high-fiber diet.   Increase physical activity as tolerated Repeat fasting lipids at follow-up in 6-8 weeks   Disposition: Follow-up with Dr. Gwenlyn Found in 4 to 6 months.  Home Medications    Prior to Admission medications   Medication Sig Start Date End Date Taking? Authorizing Provider  ALPRAZolam Duanne Moron) 1 MG tablet TAKE 1 TABLET  BY MOUTH AT BEDTIME AS NEEDED FOR ANXIETY. 03/02/22   Cottle, Billey Co., MD    Family History    Family History  Problem Relation Age of Onset   Hypertension Mother    Healthy Father    He indicated that his mother is alive. He indicated that his father is alive. He indicated that his sister is alive. He indicated that his brother is alive.  Social History    Social History   Socioeconomic  History   Marital status: Married    Spouse name: Not on file   Number of children: Not on file   Years of education: Not on file   Highest education level: Not on file  Occupational History   Not on file  Tobacco Use   Smoking status: Former    Years: 10.00    Types: Cigarettes    Quit date: 2004    Years since quitting: 19.6   Smokeless tobacco: Never  Vaping Use   Vaping Use: Never used  Substance and Sexual Activity   Alcohol use: Yes    Alcohol/week: 6.0 standard drinks of alcohol    Types: 6 Standard drinks or equivalent per week    Comment: socially   Drug use: Never   Sexual activity: Not on file  Other Topics Concern   Not on file  Social History Narrative   Not on file   Social Determinants of Health   Financial Resource Strain: Not on file  Food Insecurity: Not on file  Transportation Needs: Not on file  Physical Activity: Not on file  Stress: Not on file  Social Connections: Not on file  Intimate Partner Violence: Not on file     Review of Systems    General:  No chills, fever, night sweats or weight changes.  Cardiovascular:  No chest pain, dyspnea on exertion, edema, orthopnea, palpitations, paroxysmal nocturnal dyspnea. Dermatological: No rash, lesions/masses Respiratory: No cough, dyspnea Urologic: No hematuria, dysuria Abdominal:   No nausea, vomiting, diarrhea, bright red blood per rectum, melena, or hematemesis Neurologic:  No visual changes, wkns, changes in mental status. All other systems reviewed and are otherwise negative except as noted above.  Physical Exam    VS:  There were no vitals taken for this visit. , BMI There is no height or weight on file to calculate BMI. GEN: Well nourished, well developed, in no acute distress. HEENT: normal. Neck: Supple, no JVD, carotid bruits, or masses. Cardiac: RRR, no murmurs, rubs, or gallops. No clubbing, cyanosis, edema.  Radials/DP/PT 2+ and equal bilaterally.  Respiratory:  Respirations  regular and unlabored, clear to auscultation bilaterally. GI: Soft, nontender, nondistended, BS + x 4. MS: no deformity or atrophy. Skin: warm and dry, no rash. Neuro:  Strength and sensation are intact. Psych: Normal affect.  Accessory Clinical Findings    Recent Labs: 03/10/2022: ALT 57; BUN 17; Creatinine 1.07; Hemoglobin 15.8; Platelet Count 258; Potassium 4.1; Sodium 135; TSH 2.055   Recent Lipid Panel No results found for: "CHOL", "TRIG", "HDL", "CHOLHDL", "VLDL", "LDLCALC", "LDLDIRECT"  ECG personally reviewed by me today- *** - No acute changes  Cardiac event monitor 03/30/22  Patch Wear Time:  6 days and 23 hours (2023-06-15T16:51:51-0400 to 2023-06-22T16:29:05-399)   Patient had a min HR of 35 bpm, max HR of 168 bpm, and avg HR of 61 bpm. Predominant underlying rhythm was Sinus Rhythm. Isolated SVEs were rare (<1.0%), SVE Couplets were rare (<1.0%), and SVE Triplets were rare (<1.0%). Isolated VEs were rare (<  1.0%),  and no VE Couplets or VE Triplets were present. Ventricular Bigeminy and Trigeminy were present.    1. SR/SB ( HR high 40s to 50s)/ ST 2. Rare PACs/PVCs  Assessment & Plan   1.  ***   Jossie Ng. Seena Ritacco NP-C     05/07/2022, 3:04 PM [address] [place] [address] [address] Office [phone] Fax [phone]  Notice: This dictation was prepared with Dragon dictation along with smaller phrase technology. Any transcriptional errors that result from this process are unintentional and may not be corrected upon review.  I spent***minutes examining this patient, reviewing medications, and using patient centered shared decision making involving her cardiac care.  Prior to her visit I spent greater than 20 minutes reviewing her past medical history,  medications, and prior cardiac tests.

## 2022-05-10 ENCOUNTER — Ambulatory Visit: Payer: BC Managed Care – PPO | Admitting: General Practice

## 2022-05-14 ENCOUNTER — Other Ambulatory Visit: Payer: Self-pay | Admitting: Oncology

## 2022-05-17 ENCOUNTER — Encounter: Payer: Self-pay | Admitting: General Practice

## 2022-05-18 ENCOUNTER — Telehealth: Payer: Self-pay | Admitting: *Deleted

## 2022-05-18 ENCOUNTER — Inpatient Hospital Stay: Payer: BC Managed Care – PPO

## 2022-05-18 ENCOUNTER — Inpatient Hospital Stay: Payer: BC Managed Care – PPO | Attending: Oncology

## 2022-05-18 ENCOUNTER — Inpatient Hospital Stay: Payer: BC Managed Care – PPO | Admitting: Oncology

## 2022-05-18 NOTE — Telephone Encounter (Signed)
PC to patient regarding missed appointments, no answer, left VM - informed patient scheduling will contact him to reschedule.  Scheduling message sent.

## 2022-05-19 ENCOUNTER — Telehealth: Payer: Self-pay | Admitting: Oncology

## 2022-05-19 NOTE — Telephone Encounter (Signed)
R/s per 8/16 sch msg, message has been left

## 2022-05-20 ENCOUNTER — Inpatient Hospital Stay: Payer: BC Managed Care – PPO

## 2022-05-20 ENCOUNTER — Inpatient Hospital Stay: Payer: BC Managed Care – PPO | Admitting: Oncology

## 2022-05-24 ENCOUNTER — Telehealth: Payer: Self-pay | Admitting: Oncology

## 2022-05-24 NOTE — Telephone Encounter (Signed)
Scheduled per 8/17 in basket, message has been left

## 2022-05-26 ENCOUNTER — Other Ambulatory Visit: Payer: Self-pay

## 2022-05-29 ENCOUNTER — Encounter: Payer: Self-pay | Admitting: Oncology

## 2022-05-31 ENCOUNTER — Encounter: Payer: Self-pay | Admitting: Adult Health

## 2022-05-31 ENCOUNTER — Ambulatory Visit: Payer: BC Managed Care – PPO | Admitting: Adult Health

## 2022-05-31 DIAGNOSIS — F411 Generalized anxiety disorder: Secondary | ICD-10-CM | POA: Diagnosis not present

## 2022-05-31 MED ORDER — ALPRAZOLAM 1 MG PO TABS: ORAL_TABLET | ORAL | 2 refills | Status: DC

## 2022-05-31 NOTE — Progress Notes (Signed)
[name] [place] [id number] [birth year] 41 y.o.  Subjective:   Patient ID:  Steve Jimenez is a 41 y.o. (DOB December 08, 1980) male.  Chief Complaint: No chief complaint on file.   HPI Steve Jimenez presents to the office today for follow-up of anxiety.  Describes mood today as "ok". Pleasant. Mood symptoms - denies depression and irritability. Reports increased anxiety at times. Reports one panic attack. Mood is consistent. Stating "I'm doing good overall". Feels like Xanax continues to work well. Completed immunotherapy - follow up in 3 months. Stable interest and motivation. Taking medications as prescribed.  Energy levels stable. Active, does not have a regular exercise routine.  Enjoys some usuaIl interests and activities. Married. Lives with wife and 4 children. Parents local. Spending time with family. Appetite adequate. Weight stable - 220 pounds. Sleeps better some nights than others. Averages 4 to 6 hours during the week and 10 hours on the weekend.  Focus and concentration stable. Completing tasks. Managing aspects of household. 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 Denies SI or HI.  Denies AH or VH.  Seeing therapist - Elnora Morrison.  Previous medication trials: Xanax   Flowsheet Row ED from 08/03/2021 in [facility] [place] from 03/27/2021 in [place] CATEGORY Error: Question 6 not populated No Risk        Review of Systems:  Review of Systems  Musculoskeletal:  Negative for gait problem.  Neurological:  Negative for tremors.  Psychiatric/Behavioral:         Please refer to HPI    Medications: I have reviewed the patient's current medications.  Current Outpatient Medications  Medication Sig Dispense Refill   ALPRAZolam (XANAX) 1 MG tablet TAKE 1 TABLET BY MOUTH AT BEDTIME AS NEEDED FOR ANXIETY. 30 tablet 2    No current facility-administered medications for this visit.    Medication Side Effects: None  Allergies:  Allergies  Allergen Reactions   Bee Venom Anaphylaxis    Other reaction(s): anaphylaxis   Aspirin     Other reaction(s): as a child    Past Medical History:  Diagnosis Date   Anginal pain ([place]) 10/2019   due to anxitey   Anxiety     Past Medical History, Surgical history, Social history, and Family history were reviewed and updated as appropriate.   Please see review of systems for further details on the patient's review from today.   Objective:   Physical Exam:  There were no vitals taken for this visit.  Physical Exam Constitutional:      General: He is not in acute distress. Musculoskeletal:        General: No deformity.  Neurological:     Mental Status: He is alert and oriented to person, place, and time.     Coordination: Coordination normal.  Psychiatric:        Attention and Perception: Attention and perception normal. He does not perceive auditory or visual hallucinations.        Mood and Affect: Mood normal. Mood is not anxious or depressed. Affect is not labile, blunt, angry or inappropriate.        Speech: Speech normal.        Behavior: Behavior normal.        Thought Content: Thought content normal. Thought content is not paranoid or delusional. Thought content does not include homicidal or suicidal ideation. Thought content does not include homicidal  or suicidal plan.        Cognition and Memory: Cognition and memory normal.        Judgment: Judgment normal.     Comments: Insight intact     Lab Review:     Component Value Date/Time   NA 135 03/10/2022 1109   K 4.1 03/10/2022 1109   CL 107 03/10/2022 1109   CO2 22 03/10/2022 1109   GLUCOSE 100 (H) 03/10/2022 1109   BUN 17 03/10/2022 1109   CREATININE 1.07 03/10/2022 1109   CALCIUM 10.2 03/10/2022 1109   PROT 8.6 (H) 03/10/2022 1109   ALBUMIN 4.5 03/10/2022 1109   AST 38 03/10/2022  1109   ALT 57 (H) 03/10/2022 1109   ALKPHOS 83 03/10/2022 1109   BILITOT 0.4 03/10/2022 1109   GFRNONAA >60 03/10/2022 1109       Component Value Date/Time   WBC 4.9 03/10/2022 1109   WBC 11.6 (H) 04/04/2021 0515   RBC 5.07 03/10/2022 1109   HGB 15.8 03/10/2022 1109   HCT 44.5 03/10/2022 1109   PLT 258 03/10/2022 1109   MCV 87.8 03/10/2022 1109   MCH 31.2 03/10/2022 1109   MCHC 35.5 03/10/2022 1109   RDW 11.9 03/10/2022 1109   LYMPHSABS 1.2 03/10/2022 1109   MONOABS 0.4 03/10/2022 1109   EOSABS 0.3 03/10/2022 1109   BASOSABS 0.0 03/10/2022 1109    No results found for: "POCLITH", "LITHIUM"   No results found for: "PHENYTOIN", "PHENOBARB", "VALPROATE", "CBMZ"   .res Assessment: Plan:    Plan:  PDMP reviewed  1. Increase Xanax '1mg'$  daily PRN anxiety    RTC 6 months - will call in 3 months for refills.  Patient advised to contact office with any questions, adverse effects, or acute worsening in signs and symptoms.  Discussed potential benefits, risk, and side effects of benzodiazepines to include potential risk of tolerance and dependence, as well as possible drowsiness.  Advised patient not to drive if experiencing drowsiness and to take lowest possible effective dose to minimize risk of dependence and tolerance.  Time spent with patient was 42 minutes.Greater than 50% of face to face time with patient was spent on counseling and coordination of care. We discussed anxiety and panic attacks. Discussed continuing to seeing his therapist.   Please see After Visit Summary for patient specific instructions.  Future Appointments  Date Time Provider [place]  05/31/2022  8:00 AM Yates Weisgerber, Berdie Ogren, NP [facility] None  06/04/2022  8:45 AM [facility] LAB [facility] None  06/04/2022  9:15 AM Wyatt Portela, MD [facility] None  06/04/2022 10:00 AM [facility] INFUSION [facility] None    No orders of the defined types were placed in this  encounter.   -------------------------------

## 2022-06-01 ENCOUNTER — Other Ambulatory Visit: Payer: Self-pay

## 2022-06-04 ENCOUNTER — Inpatient Hospital Stay: Payer: BC Managed Care – PPO | Admitting: Oncology

## 2022-06-04 ENCOUNTER — Telehealth: Payer: Self-pay | Admitting: *Deleted

## 2022-06-04 ENCOUNTER — Inpatient Hospital Stay: Payer: BC Managed Care – PPO

## 2022-06-04 ENCOUNTER — Inpatient Hospital Stay: Payer: BC Managed Care – PPO | Attending: Oncology

## 2022-06-04 NOTE — Telephone Encounter (Signed)
PC to patient, also his wife - no answer, left VM on both phone numbers regarding missed appointments, patient has not been seen at [facility] since April.  Requested that patient or his wife call to inform us of his plan. Dr Alen Blew aware.

## 2022-06-07 ENCOUNTER — Ambulatory Visit (HOSPITAL_COMMUNITY): Payer: BC Managed Care – PPO

## 2022-07-07 DIAGNOSIS — J339 Nasal polyp, unspecified: Secondary | ICD-10-CM | POA: Diagnosis not present

## 2022-07-07 DIAGNOSIS — R430 Anosmia: Secondary | ICD-10-CM | POA: Diagnosis not present

## 2022-07-10 IMAGING — DX DG ANKLE COMPLETE 3+V*R*
3 series · 3 of 3 positions shown · non-contrast
Comparison: None.

CLINICAL DATA: Jumped from 6 ft height. Right ankle injury and
pain. Initial encounter.

EXAM:
RIGHT ANKLE - COMPLETE 3+ VIEW

[ankle ap]
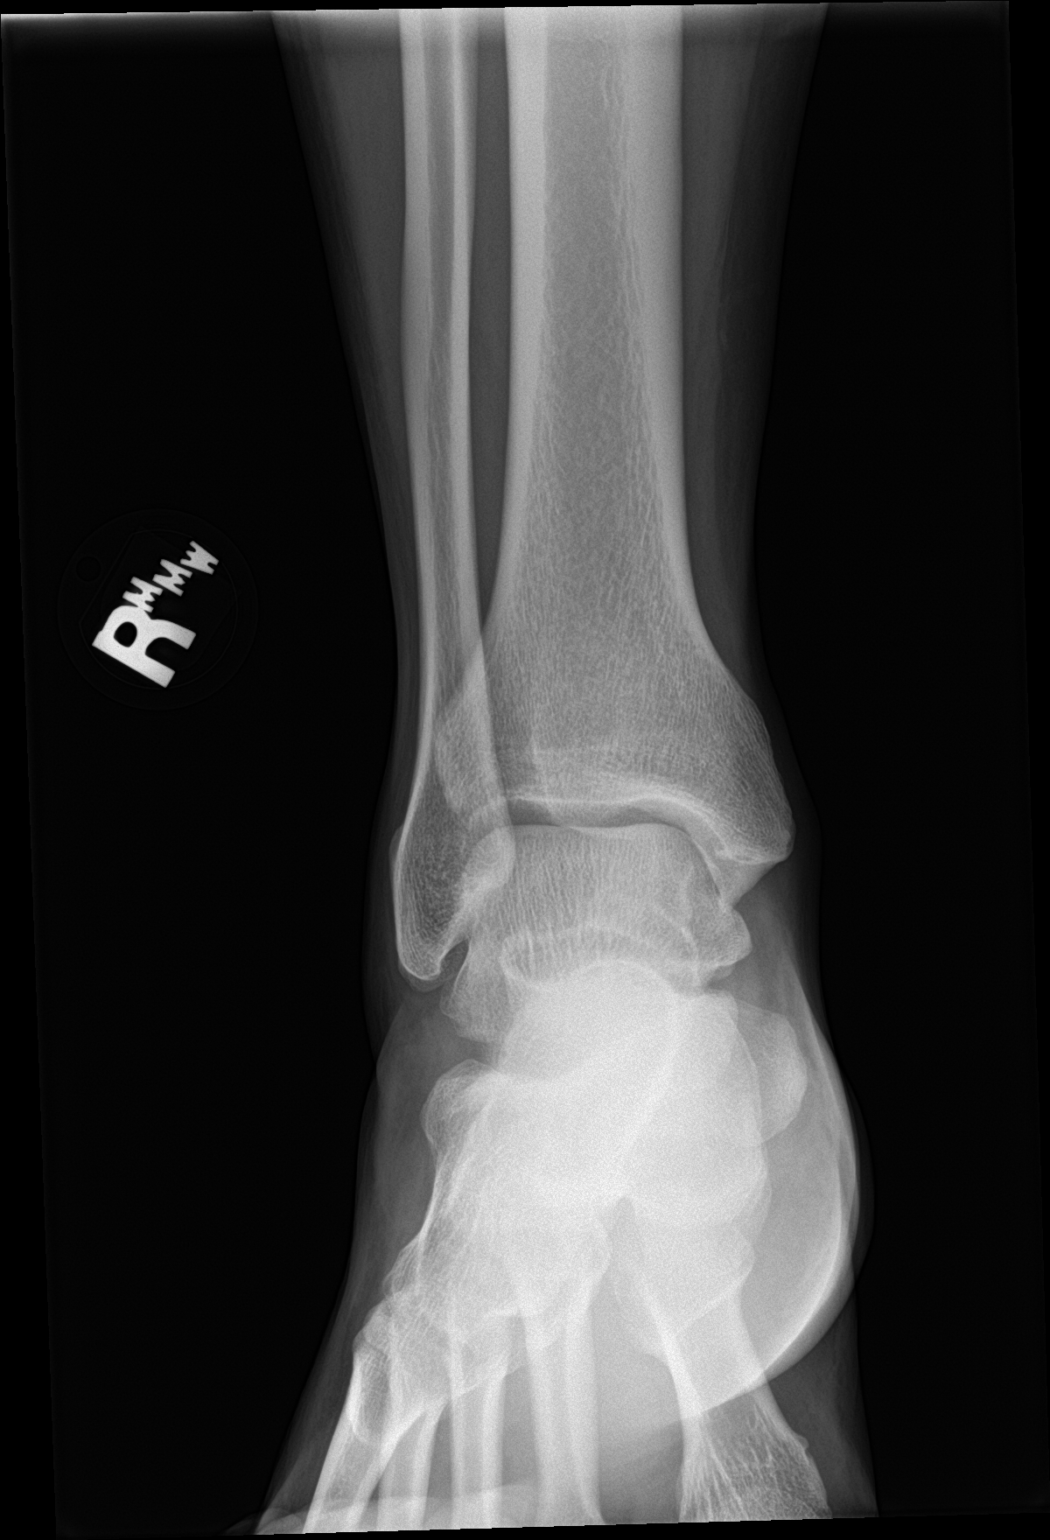

[ankle obl]
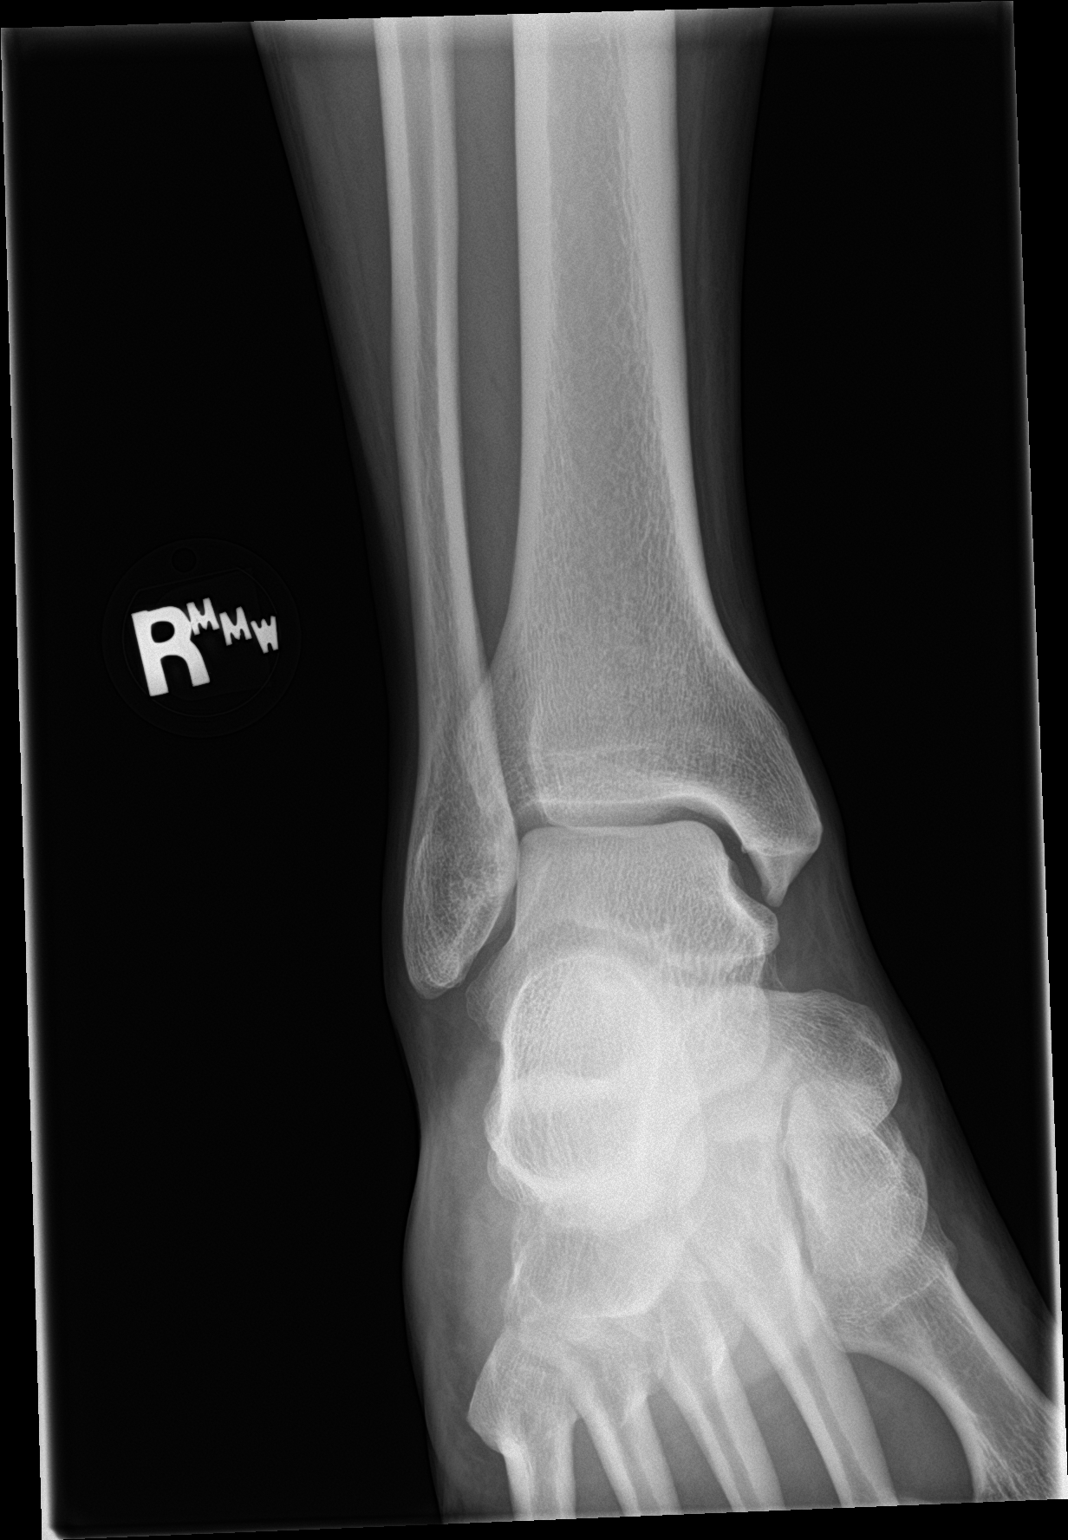

[ankle lat]
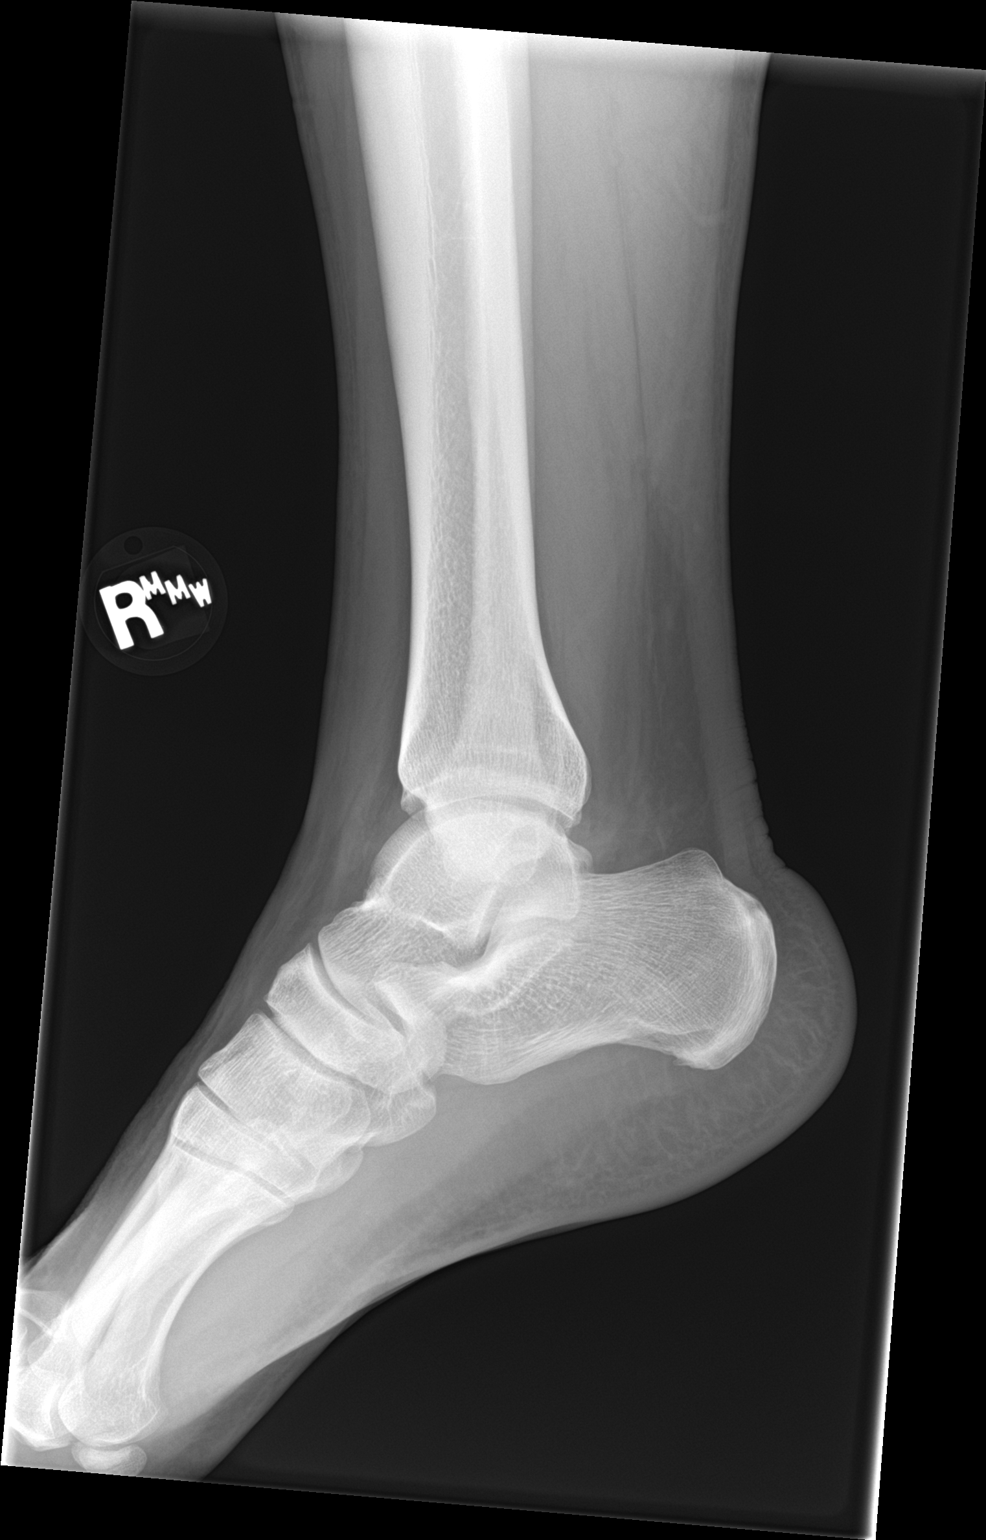

[3 of 3 positions shown; findings below may reference images not displayed]

FINDINGS: There is no evidence of fracture, dislocation, or joint effusion.
There is no evidence of arthropathy or other focal bone abnormality.
Soft tissues are unremarkable.
IMPRESSION: Negative.

## 2022-08-10 IMAGING — CT CT CHEST W/ CM
2 of 4 series · 15 of 46 positions shown, 17 images · IV contrast (APPLIED)
Comparison: 05/21/2021

CLINICAL DATA: Restaging left renal neoplasm status post left
nephrectomy.

EXAM:
CT CHEST WITH CONTRAST
CT ABDOMEN AND PELVIS WITH AND WITHOUT CONTRAST
TECHNIQUE: Multidetector CT imaging of the chest was performed during
intravenous contrast administration. Multidetector CT imaging of the
abdomen and pelvis was performed following the standard protocol
before and during bolus administration of intravenous contrast.
CONTRAST:  80mL OMNIPAQUE IOHEXOL 350 MG/ML SOLN

[Series 504: axial arterial · axial · arterial · 0.96mm/px · z∈[+1240,+1648]mm · 12 of 158 slices shown, 14 images]
[im 11/158  soft-tissue]
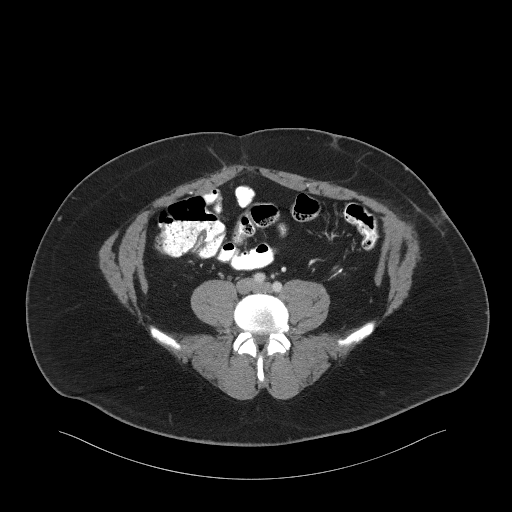
[im 11/158  bone]
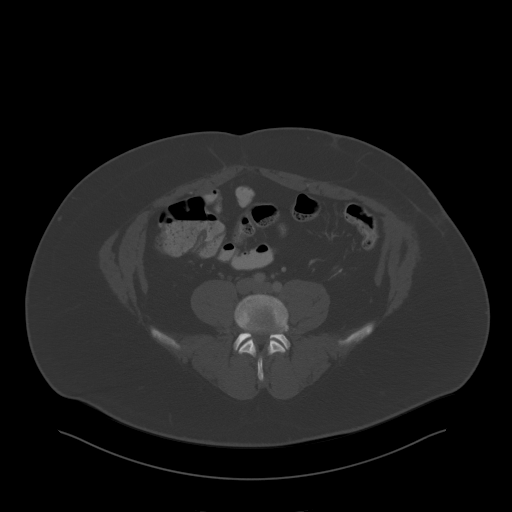
[im 21/158  soft-tissue]
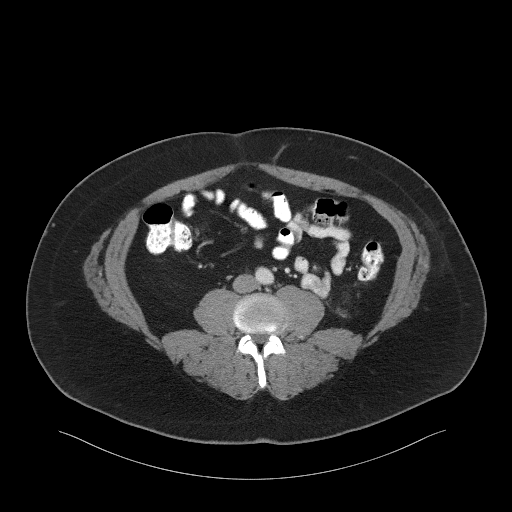
[im 32/158  soft-tissue]
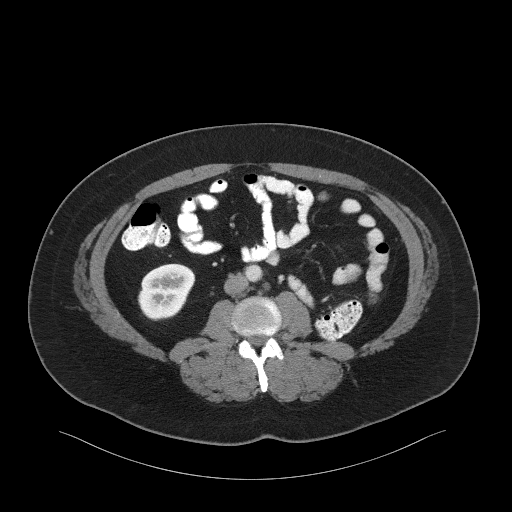
[im 53/158  soft-tissue]
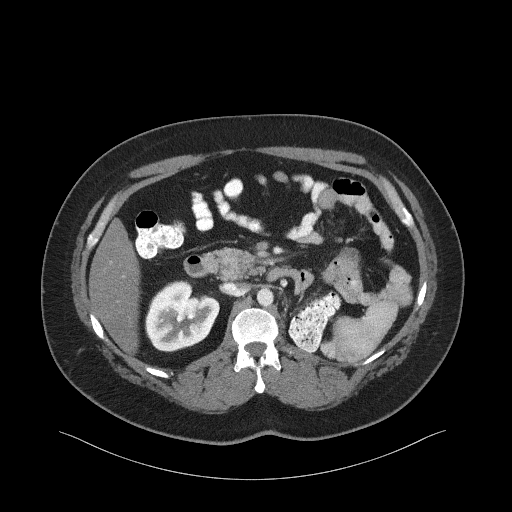
[im 63/158  soft-tissue]
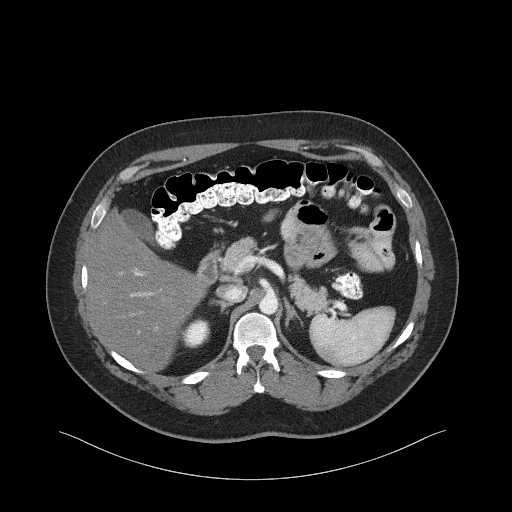
[im 74/158  soft-tissue]
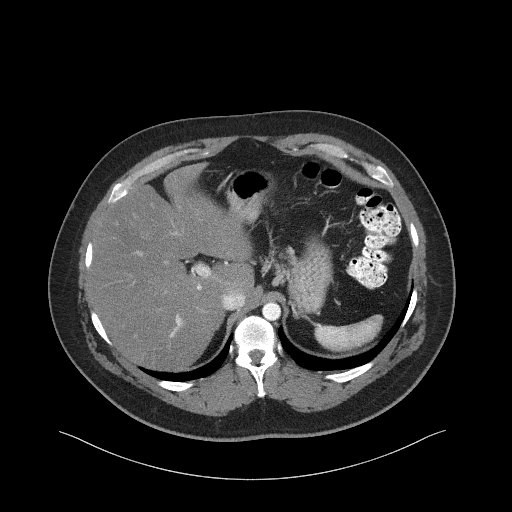
[im 84/158  soft-tissue]
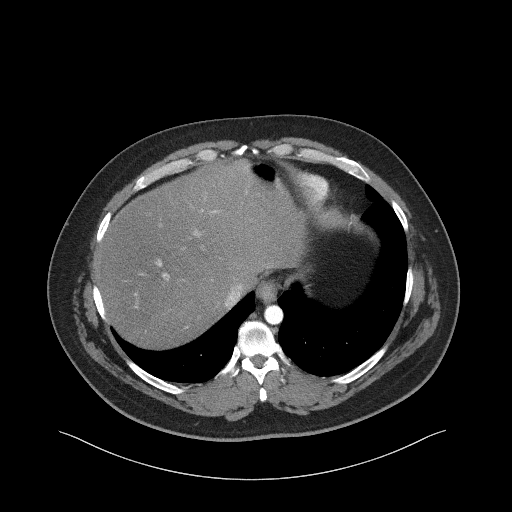
[im 95/158  soft-tissue]
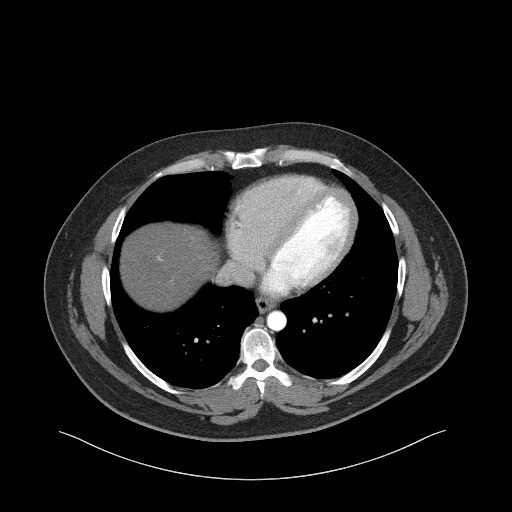
[im 105/158  soft-tissue]
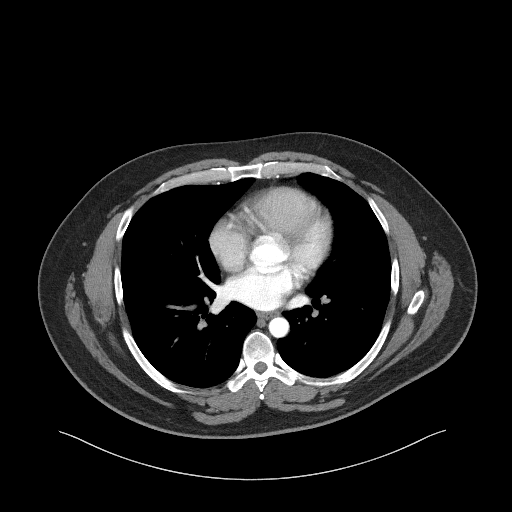
[im 105/158  bone]
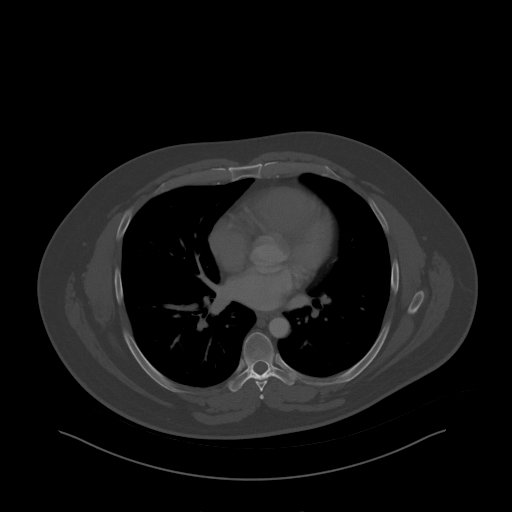
[im 126/158  soft-tissue]
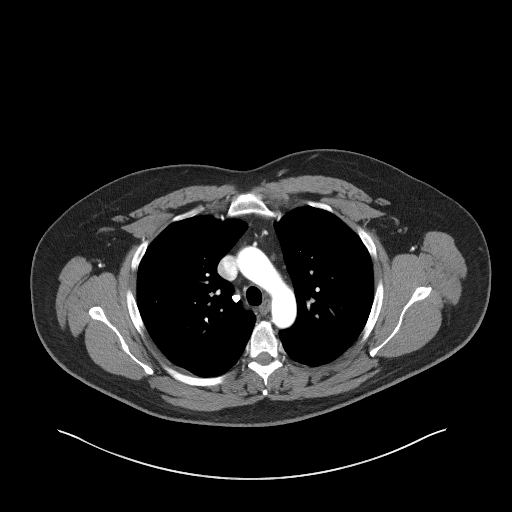
[im 137/158  soft-tissue]
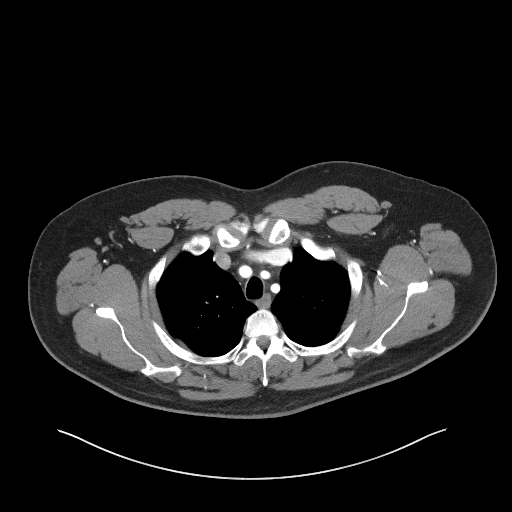
[im 147/158  soft-tissue]
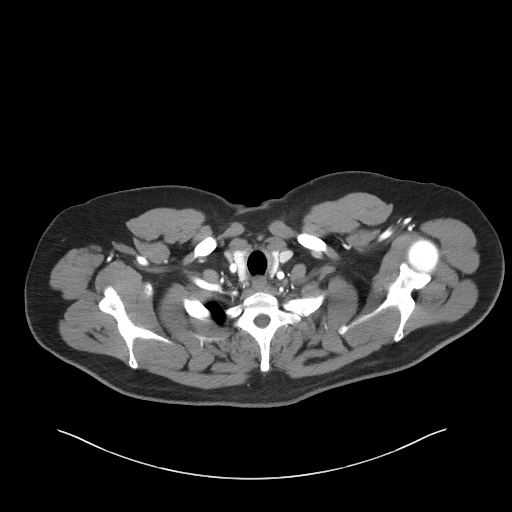

[Series 506: coronal arterial · coronal · arterial · 0.99mm/px · 3 of 118 slices shown]
[im 40/118  soft-tissue]
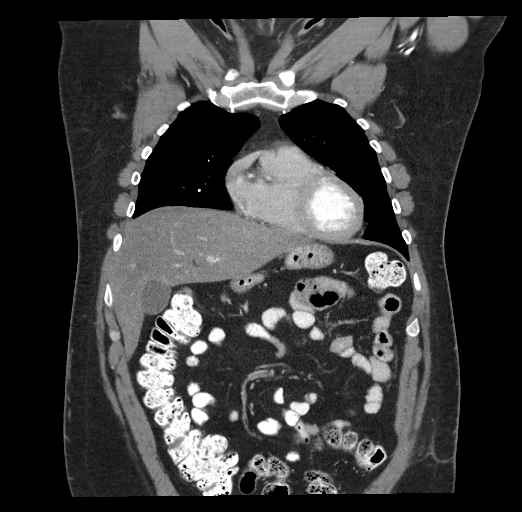
[im 53/118  soft-tissue]
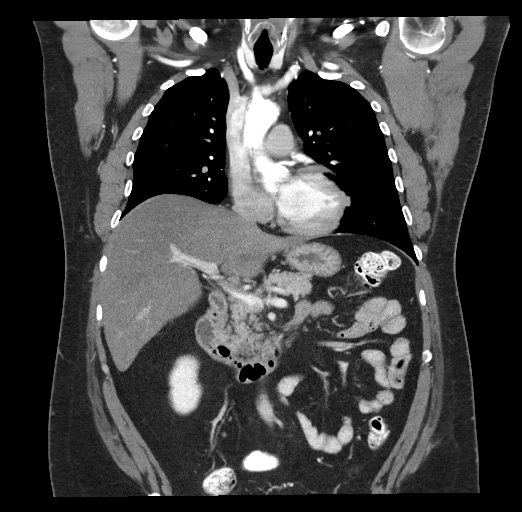
[im 66/118  soft-tissue]
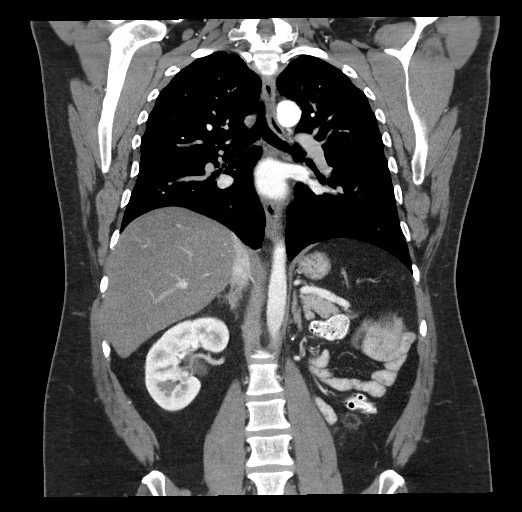

[15 of 46 positions shown; findings below may reference images not displayed]

FINDINGS: CT CHEST FINDINGS

Cardiovascular: Heart size is normal.  No pericardial effusion.

Mediastinum/Nodes: No enlarged mediastinal, hilar, or axillary lymph
nodes. Thyroid gland, trachea, and esophagus demonstrate no
significant findings. Calcified right paratracheal lymph node
compatible with remote granulomatous disease.

Lungs/Pleura: No pleural effusion, airspace consolidation, or
atelectasis. No suspicious pulmonary nodule or mass identified.

Musculoskeletal: No chest wall mass or suspicious bone lesions
identified.

CT ABDOMEN AND PELVIS FINDINGS

Hepatobiliary: Hepatic steatosis. No suspicious liver abnormality.
Gallbladder appears within normal limits. No biliary dilatation.

Pancreas: Unremarkable. No pancreatic ductal dilatation or
surrounding inflammatory changes.

Spleen: Normal in size without focal abnormality.

Adrenals/Urinary Tract: Normal adrenal glands. Right kidney is
unremarkable.

Status post left nephrectomy. Similar appearance of fat necrosis
within the left nephrectomy bed measuring 2.7 cm on today's study,
image 59/10. Formally 3.4 cm. Small nodule within the left
nephrectomy bed measures 5 mm, image 64/10. Formally this measured 8
mm.

Bladder is unremarkable.

Stomach/Bowel: Stomach is within normal limits. Status post
appendectomy. No evidence of bowel wall thickening, distention, or
inflammatory changes.

Vascular/Lymphatic: No significant vascular findings are present. No
enlarged abdominal or pelvic lymph nodes.

Reproductive: Prostate is unremarkable.

Other: No free fluid or fluid collections.

Musculoskeletal: Soft tissue stranding identified within the left
lateral ventral abdominal wall a compatible with post surgical
changed. Within the right lower quadrant ventral abdominal wall
there is a focal area of skin thickening, subcutaneous soft tissue
stranding with aim small subcutaneous nodule measuring 8 mm, image
118/12. This appears new from the previous exam and is favored
inflammatory or infectious change perhaps related to an and
subcutaneous injection site.

No acute or suspicious osseous findings.
IMPRESSION: 1. Status post left nephrectomy. None no specific findings
identified to suggest residual or recurrence of tumor or metastatic
disease.
2. Decreased size of previously reported fat necrosis within the
left nephrectomy bed. Also, previously reported nodule within the
left nephrectomy bed is decreased in size in the interval.
3. Hepatic steatosis.
4. Focal area of skin thickening, subcutaneous soft tissue stranding
and small subcutaneous nodule within the right lower quadrant
ventral abdominal wall is new from the previous exam and is favored
to represent inflammatory or infectious change perhaps related to
injection site. Clinical correlation advised.

## 2022-08-10 IMAGING — CT CT ABD-PEL WO/W CM
3 of 13 series · 11 of 46 positions shown, 17 images · IV contrast (omnipaque)
Comparison: 05/21/2021

CLINICAL DATA: Restaging left renal neoplasm status post left
nephrectomy.

EXAM:
CT CHEST WITH CONTRAST
CT ABDOMEN AND PELVIS WITH AND WITHOUT CONTRAST
TECHNIQUE: Multidetector CT imaging of the chest was performed during
intravenous contrast administration. Multidetector CT imaging of the
abdomen and pelvis was performed following the standard protocol
before and during bolus administration of intravenous contrast.
CONTRAST:  80mL OMNIPAQUE IOHEXOL 350 MG/ML SOLN

[Series 6: axial arterial · axial · arterial · 0.96mm/px · z∈[+1303,+1585]mm · 4 of 158 slices shown]
[im 32/158  soft-tissue]
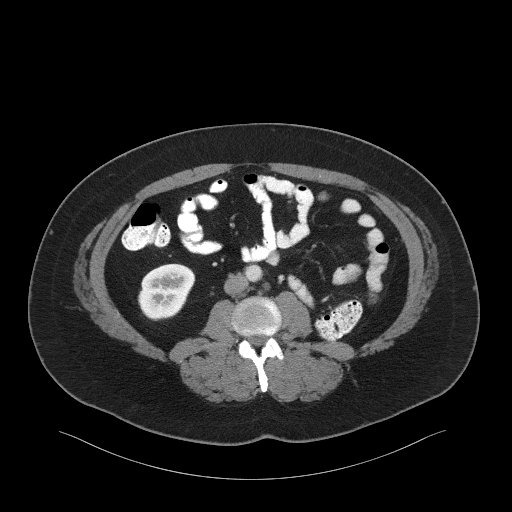
[im 63/158  soft-tissue]
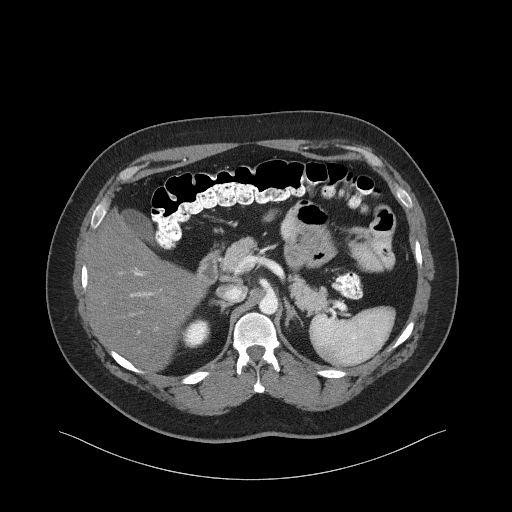
[im 95/158  soft-tissue]
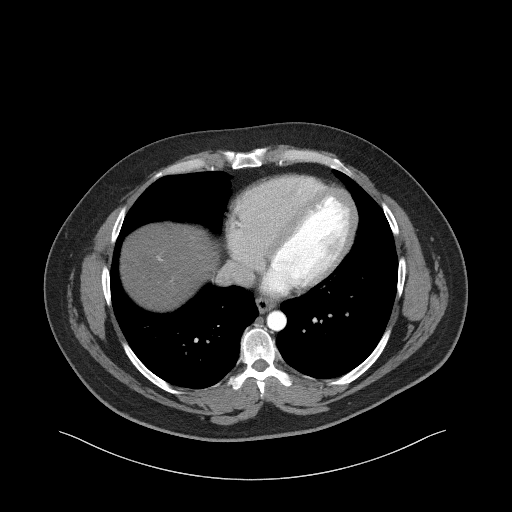
[im 126/158  soft-tissue]
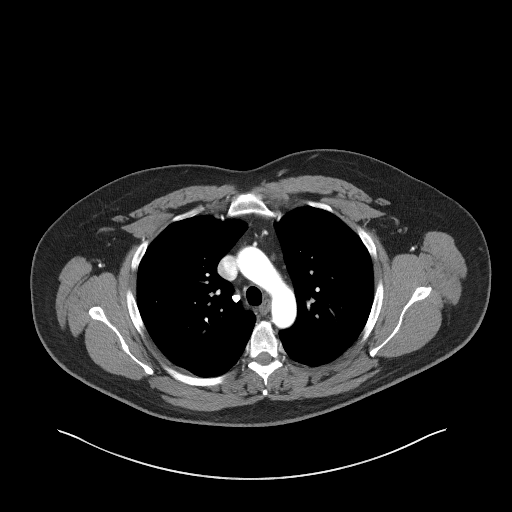

[Series 12: axial nephro · axial · 0.96mm/px · z∈[+1093,+1423]mm · 5 of 166 slices shown, 10 images]
[im 28/166  soft-tissue]
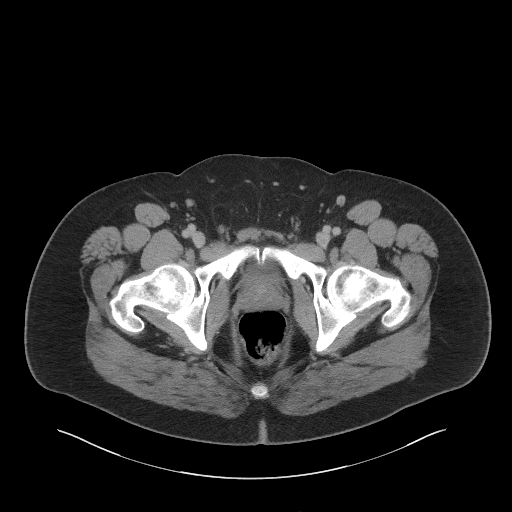
[im 28/166  bone]
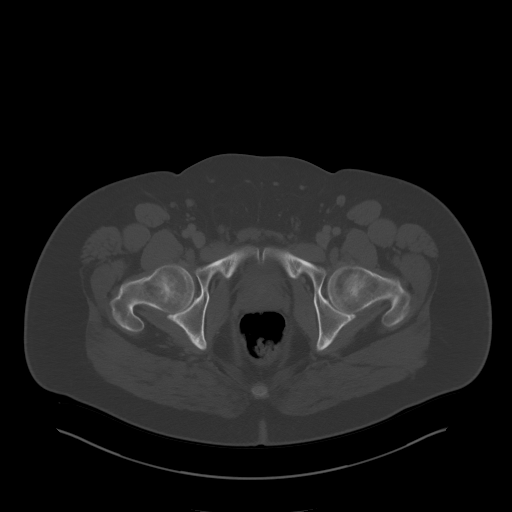
[im 56/166  soft-tissue]
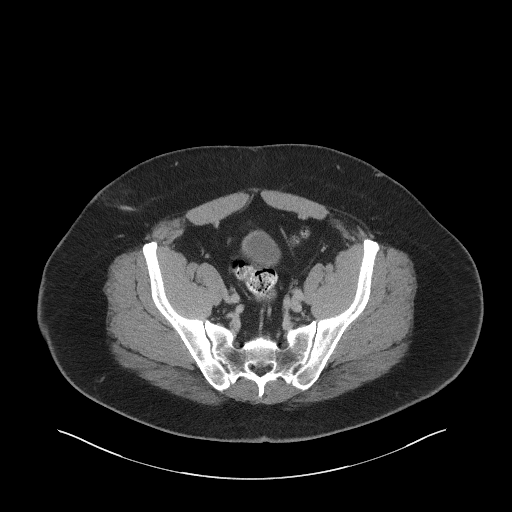
[im 56/166  lung]
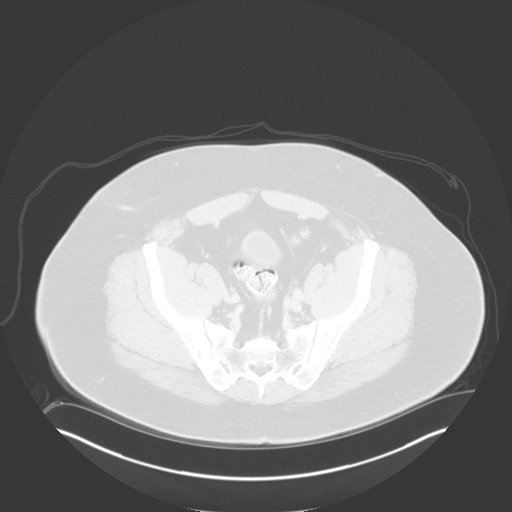
[im 83/166  soft-tissue]
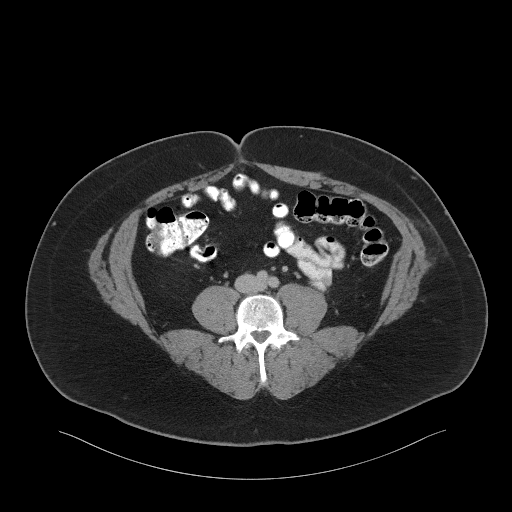
[im 83/166  lung]
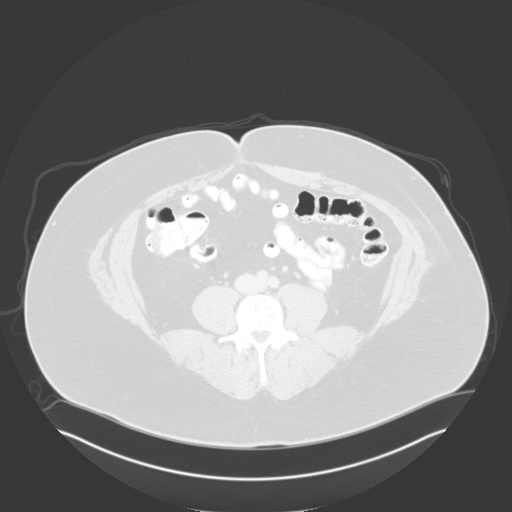
[im 111/166  soft-tissue]
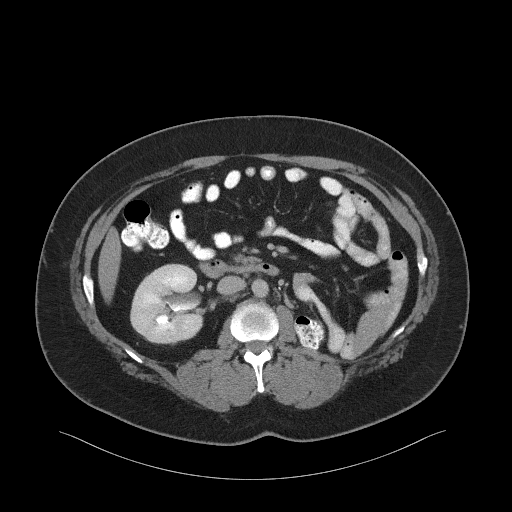
[im 111/166  lung]
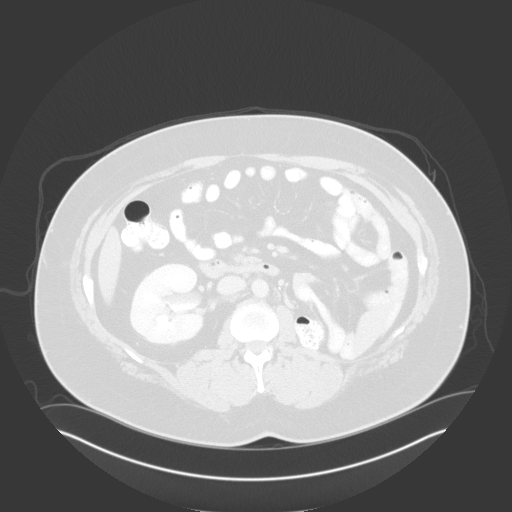
[im 138/166  soft-tissue]
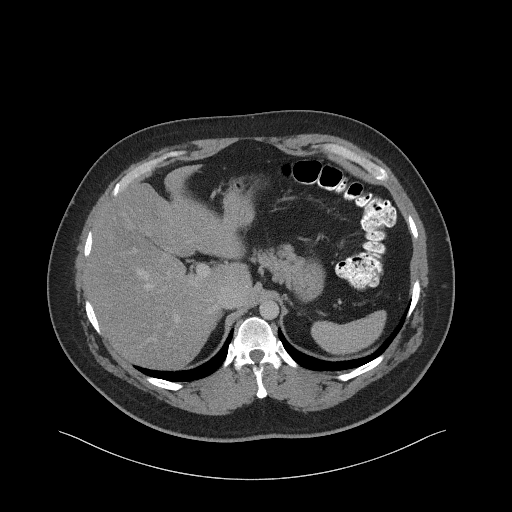
[im 138/166  lung]
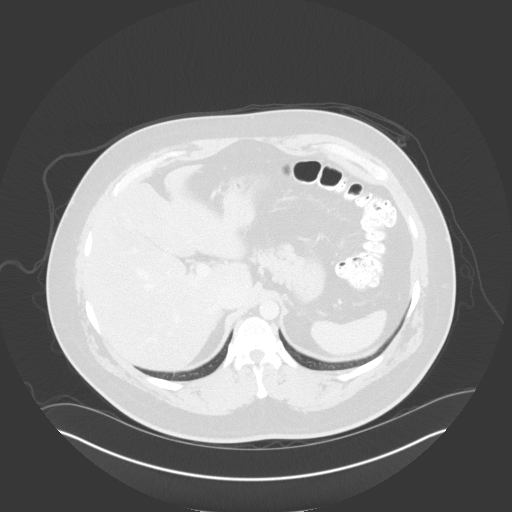

[Series 15: coronal venous · coronal · portal-venous · 0.91mm/px · 2 of 128 slices shown, 3 images]
[im 43/128  soft-tissue]
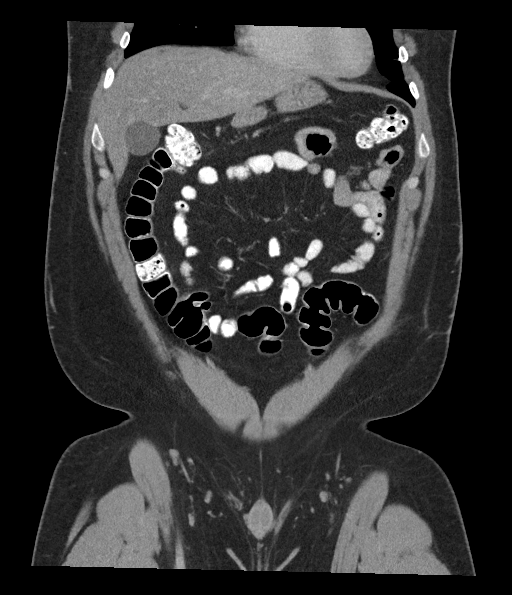
[im 43/128  bone]
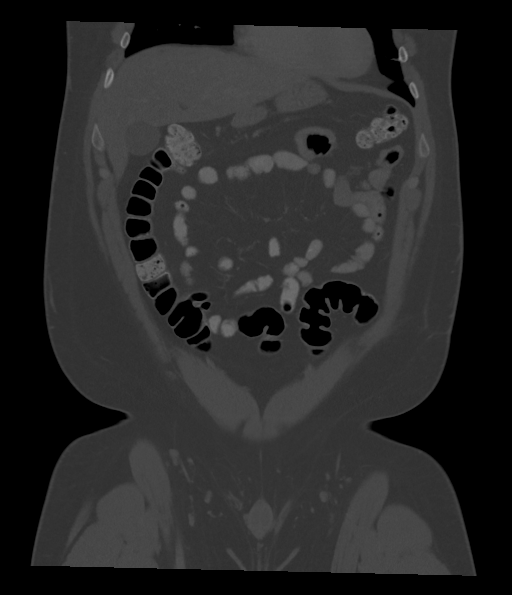
[im 85/128  soft-tissue]
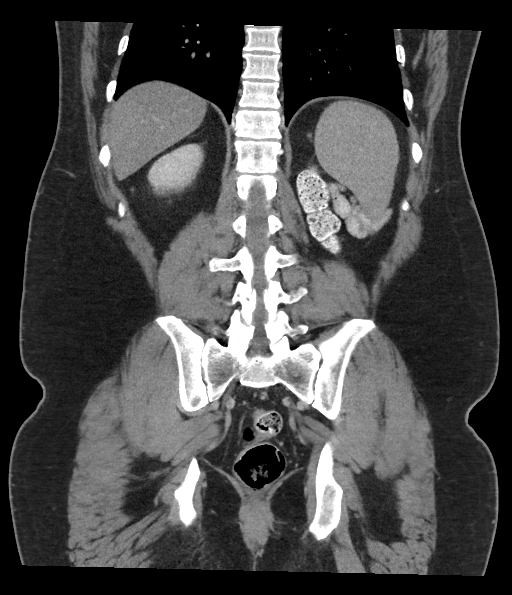

[11 of 46 positions shown; findings below may reference images not displayed]

FINDINGS: CT CHEST FINDINGS

Cardiovascular: Heart size is normal.  No pericardial effusion.

Mediastinum/Nodes: No enlarged mediastinal, hilar, or axillary lymph
nodes. Thyroid gland, trachea, and esophagus demonstrate no
significant findings. Calcified right paratracheal lymph node
compatible with remote granulomatous disease.

Lungs/Pleura: No pleural effusion, airspace consolidation, or
atelectasis. No suspicious pulmonary nodule or mass identified.

Musculoskeletal: No chest wall mass or suspicious bone lesions
identified.

CT ABDOMEN AND PELVIS FINDINGS

Hepatobiliary: Hepatic steatosis. No suspicious liver abnormality.
Gallbladder appears within normal limits. No biliary dilatation.

Pancreas: Unremarkable. No pancreatic ductal dilatation or
surrounding inflammatory changes.

Spleen: Normal in size without focal abnormality.

Adrenals/Urinary Tract: Normal adrenal glands. Right kidney is
unremarkable.

Status post left nephrectomy. Similar appearance of fat necrosis
within the left nephrectomy bed measuring 2.7 cm on today's study,
image 59/10. Formally 3.4 cm. Small nodule within the left
nephrectomy bed measures 5 mm, image 64/10. Formally this measured 8
mm.

Bladder is unremarkable.

Stomach/Bowel: Stomach is within normal limits. Status post
appendectomy. No evidence of bowel wall thickening, distention, or
inflammatory changes.

Vascular/Lymphatic: No significant vascular findings are present. No
enlarged abdominal or pelvic lymph nodes.

Reproductive: Prostate is unremarkable.

Other: No free fluid or fluid collections.

Musculoskeletal: Soft tissue stranding identified within the left
lateral ventral abdominal wall a compatible with post surgical
changed. Within the right lower quadrant ventral abdominal wall
there is a focal area of skin thickening, subcutaneous soft tissue
stranding with aim small subcutaneous nodule measuring 8 mm, image
118/12. This appears new from the previous exam and is favored
inflammatory or infectious change perhaps related to an and
subcutaneous injection site.

No acute or suspicious osseous findings.
IMPRESSION: 1. Status post left nephrectomy. None no specific findings
identified to suggest residual or recurrence of tumor or metastatic
disease.
2. Decreased size of previously reported fat necrosis within the
left nephrectomy bed. Also, previously reported nodule within the
left nephrectomy bed is decreased in size in the interval.
3. Hepatic steatosis.
4. Focal area of skin thickening, subcutaneous soft tissue stranding
and small subcutaneous nodule within the right lower quadrant
ventral abdominal wall is new from the previous exam and is favored
to represent inflammatory or infectious change perhaps related to
injection site. Clinical correlation advised.

## 2022-08-17 DIAGNOSIS — J339 Nasal polyp, unspecified: Secondary | ICD-10-CM | POA: Diagnosis not present

## 2022-08-17 DIAGNOSIS — J329 Chronic sinusitis, unspecified: Secondary | ICD-10-CM | POA: Diagnosis not present

## 2022-08-17 DIAGNOSIS — R438 Other disturbances of smell and taste: Secondary | ICD-10-CM | POA: Diagnosis not present

## 2022-09-16 ENCOUNTER — Other Ambulatory Visit: Payer: Self-pay

## 2022-09-16 ENCOUNTER — Telehealth: Payer: Self-pay | Admitting: Adult Health

## 2022-09-16 DIAGNOSIS — J3489 Other specified disorders of nose and nasal sinuses: Secondary | ICD-10-CM | POA: Diagnosis not present

## 2022-09-16 DIAGNOSIS — R438 Other disturbances of smell and taste: Secondary | ICD-10-CM | POA: Diagnosis not present

## 2022-09-16 DIAGNOSIS — F411 Generalized anxiety disorder: Secondary | ICD-10-CM

## 2022-09-16 DIAGNOSIS — J329 Chronic sinusitis, unspecified: Secondary | ICD-10-CM | POA: Diagnosis not present

## 2022-09-16 DIAGNOSIS — J339 Nasal polyp, unspecified: Secondary | ICD-10-CM | POA: Diagnosis not present

## 2022-09-16 MED ORDER — ALPRAZOLAM 1 MG PO TABS: ORAL_TABLET | ORAL | 2 refills | Status: DC

## 2022-09-16 NOTE — Telephone Encounter (Signed)
Pended.

## 2022-09-16 NOTE — Telephone Encounter (Signed)
Patient called in for refill on Alprazolam '1mg'$ . Ph: [phone] Appt 2/26 Pharmacy [facility] [id number] [place]

## 2022-10-05 DIAGNOSIS — D49512 Neoplasm of unspecified behavior of left kidney: Secondary | ICD-10-CM | POA: Diagnosis not present

## 2022-10-05 DIAGNOSIS — C642 Malignant neoplasm of left kidney, except renal pelvis: Secondary | ICD-10-CM | POA: Diagnosis not present

## 2022-10-08 DIAGNOSIS — K760 Fatty (change of) liver, not elsewhere classified: Secondary | ICD-10-CM | POA: Diagnosis not present

## 2022-10-08 DIAGNOSIS — Z85528 Personal history of other malignant neoplasm of kidney: Secondary | ICD-10-CM | POA: Diagnosis not present

## 2022-10-08 DIAGNOSIS — N2889 Other specified disorders of kidney and ureter: Secondary | ICD-10-CM | POA: Diagnosis not present

## 2022-10-08 DIAGNOSIS — C642 Malignant neoplasm of left kidney, except renal pelvis: Secondary | ICD-10-CM | POA: Diagnosis not present

## 2022-10-20 DIAGNOSIS — J324 Chronic pansinusitis: Secondary | ICD-10-CM | POA: Diagnosis not present

## 2022-11-29 ENCOUNTER — Ambulatory Visit: Payer: BC Managed Care – PPO | Admitting: Adult Health

## 2022-11-29 ENCOUNTER — Encounter: Payer: Self-pay | Admitting: Adult Health

## 2022-11-29 DIAGNOSIS — F411 Generalized anxiety disorder: Secondary | ICD-10-CM

## 2022-11-29 MED ORDER — ALPRAZOLAM 1 MG PO TABS: ORAL_TABLET | ORAL | 2 refills | Status: DC

## 2022-11-29 NOTE — Progress Notes (Signed)
Steve Jimenez [id number] [birth year] 42 y.o.  Subjective:   Patient ID:  Steve Jimenez is a 41 y.o. (DOB 1980-11-02) male.  Chief Complaint: No chief complaint on file.   HPI Steve Jimenez presents to the office today for follow-up of anxiety.  Describes mood today as "ok". Pleasant. Mood symptoms - denies depression and irritability. Reports anxiety - "a little bit". Denies recent panic attacks. Mood is consistent. Stating "I'm doing alright". Feels like Xanax continues to work well when needed. Stable interest and motivation.  Energy levels stable. Active, does not have a regular exercise routine.  Enjoys some usuaIl interests and activities. Married. Lives with wife and 4 children. Parents local. Spending time with family. Appetite adequate. Weight gain - 220 pounds. Sleeps well most nights. Averages 5 to 7 hours. Focus and concentration stable. Completing tasks. Managing aspects of household. 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 Denies SI or HI.  Denies AH or VH.  Seeing therapist - Elnora Morrison.  Previous medication trials: Xanax   Flowsheet Row ED from 08/03/2021 in [facility] [place] from 03/27/2021 in [place] CATEGORY Error: Question 6 not populated No Risk        Review of Systems:  Review of Systems  Musculoskeletal:  Negative for gait problem.  Neurological:  Negative for tremors.  Psychiatric/Behavioral:         Please refer to HPI    Medications: I have reviewed the patient's current medications.  Current Outpatient Medications  Medication Sig Dispense Refill   ALPRAZolam (XANAX) 1 MG tablet TAKE 1 TABLET BY MOUTH AT BEDTIME AS NEEDED FOR ANXIETY. 30 tablet 2   No current facility-administered medications for this visit.    Medication Side Effects: None  Allergies:  Allergies  Allergen  Reactions   Bee Venom Anaphylaxis    Other reaction(s): anaphylaxis   Aspirin     Other reaction(s): as a child    Past Medical History:  Diagnosis Date   Anginal pain ([place]) 10/2019   due to anxitey   Anxiety     Past Medical History, Surgical history, Social history, and Family history were reviewed and updated as appropriate.   Please see review of systems for further details on the patient's review from today.   Objective:   Physical Exam:  There were no vitals taken for this visit.  Physical Exam Constitutional:      General: He is not in acute distress. Musculoskeletal:        General: No deformity.  Neurological:     Mental Status: He is alert and oriented to person, place, and time.     Coordination: Coordination normal.  Psychiatric:        Attention and Perception: Attention and perception normal. He does not perceive auditory or visual hallucinations.        Mood and Affect: Mood normal. Mood is not anxious or depressed. Affect is not labile, blunt, angry or inappropriate.        Speech: Speech normal.        Behavior: Behavior normal.        Thought Content: Thought content normal. Thought content is not paranoid or delusional. Thought content does not include homicidal or suicidal ideation. Thought content does not include homicidal or suicidal plan.        Cognition and Memory: Cognition and memory normal.  Judgment: Judgment normal.     Comments: Insight intact     Lab Review:     Component Value Date/Time   NA 135 03/10/2022 1109   K 4.1 03/10/2022 1109   CL 107 03/10/2022 1109   CO2 22 03/10/2022 1109   GLUCOSE 100 (H) 03/10/2022 1109   BUN 17 03/10/2022 1109   CREATININE 1.07 03/10/2022 1109   CALCIUM 10.2 03/10/2022 1109   PROT 8.6 (H) 03/10/2022 1109   ALBUMIN 4.5 03/10/2022 1109   AST 38 03/10/2022 1109   ALT 57 (H) 03/10/2022 1109   ALKPHOS 83 03/10/2022 1109   BILITOT 0.4 03/10/2022 1109   GFRNONAA >60 03/10/2022 1109        Component Value Date/Time   WBC 4.9 03/10/2022 1109   WBC 11.6 (H) 04/04/2021 0515   RBC 5.07 03/10/2022 1109   HGB 15.8 03/10/2022 1109   HCT 44.5 03/10/2022 1109   PLT 258 03/10/2022 1109   MCV 87.8 03/10/2022 1109   MCH 31.2 03/10/2022 1109   MCHC 35.5 03/10/2022 1109   RDW 11.9 03/10/2022 1109   LYMPHSABS 1.2 03/10/2022 1109   MONOABS 0.4 03/10/2022 1109   EOSABS 0.3 03/10/2022 1109   BASOSABS 0.0 03/10/2022 1109    No results found for: "POCLITH", "LITHIUM"   No results found for: "PHENYTOIN", "PHENOBARB", "VALPROATE", "CBMZ"   .res Assessment: Plan:    Plan:  PDMP reviewed  1. Increase Xanax '1mg'$  daily PRN anxiety    RTC 1 year - may call for prescription between visits.  Patient advised to contact office with any questions, adverse effects, or acute worsening in signs and symptoms.  Discussed potential benefits, risk, and side effects of benzodiazepines to include potential risk of tolerance and dependence, as well as possible drowsiness.  Advised patient not to drive if experiencing drowsiness and to take lowest possible effective dose to minimize risk of dependence and tolerance.  Time spent with patient was 15 minutes.Greater than 50% of face to face time with patient was spent on counseling and coordination of care. We discussed anxiety and panic attacks. Discussed continuing to seeing his therapist.   There are no diagnoses linked to this encounter.   Please see After Visit Summary for patient specific instructions.  No future appointments.  No orders of the defined types were placed in this encounter.   -------------------------------

## 2022-12-01 ENCOUNTER — Other Ambulatory Visit: Payer: Self-pay

## 2023-03-11 ENCOUNTER — Other Ambulatory Visit: Payer: Self-pay

## 2023-03-16 ENCOUNTER — Ambulatory Visit (HOSPITAL_COMMUNITY): Payer: BC Managed Care – PPO

## 2023-03-19 ENCOUNTER — Ambulatory Visit (HOSPITAL_COMMUNITY): Payer: BC Managed Care – PPO

## 2023-03-20 ENCOUNTER — Telehealth: Payer: BC Managed Care – PPO | Admitting: Family Medicine

## 2023-03-20 DIAGNOSIS — L237 Allergic contact dermatitis due to plants, except food: Secondary | ICD-10-CM | POA: Diagnosis not present

## 2023-03-20 DIAGNOSIS — L719 Rosacea, unspecified: Secondary | ICD-10-CM | POA: Diagnosis not present

## 2023-03-20 MED ORDER — METRONIDAZOLE 0.75 % EX GEL: 1.0000 | Freq: Two times a day (BID) | CUTANEOUS | 0 refills | Status: AC

## 2023-03-20 MED ORDER — PREDNISONE 10 MG (21) PO TBPK: ORAL_TABLET | ORAL | 0 refills | Status: DC

## 2023-03-20 NOTE — Progress Notes (Signed)
Virtual Visit Consent   Steve Jimenez, you are scheduled for a virtual visit with a [facility] provider today. Just as with appointments in the office, your consent must be obtained to participate. Your consent will be active for this visit and any virtual visit you may have with one of our providers in the next 365 days. If you have a MyChart account, a copy of this consent can be sent to you electronically.  As this is a virtual visit, video technology does not allow for your provider to perform a traditional examination. This may limit your provider's ability to fully assess your condition. If your provider identifies any concerns that need to be evaluated in person or the need to arrange testing (such as labs, EKG, etc.), we will make arrangements to do so. Although advances in technology are sophisticated, we cannot ensure that it will always work on either your end or our end. If the connection with a video visit is poor, the visit may have to be switched to a telephone visit. With either a video or telephone visit, we are not always able to ensure that we have a secure connection.  By engaging in this virtual visit, you consent to the provision of healthcare and authorize for your insurance to be billed (if applicable) for the services provided during this visit. Depending on your insurance coverage, you may receive a charge related to this service.  I need to obtain your verbal consent now. Are you willing to proceed with your visit today? Steve Jimenez has provided verbal consent on 03/20/2023 for a virtual visit (video or telephone). Reed Pandy, [place]  Date: 03/20/2023 12:37 PM  Virtual Visit via Video Note   I, Reed Pandy, connected with  Steve Jimenez  ([id number], Nov 10, 1980) on 03/20/23 at 12:30 PM EDT by a video-enabled telemedicine application and verified that I am speaking with the correct person using two identifiers.  Location: Patient: Virtual Visit Location  Patient: Home Provider: Virtual Visit Location Provider: Home Office   I discussed the limitations of evaluation and management by telemedicine and the availability of in person appointments. The patient expressed understanding and agreed to proceed.    History of Present Illness: Steve Jimenez is a 42 y.o. who identifies as a male who was assigned male at birth, and is being seen today for c/o rosacea on his face.  Pt states he has a dermatology appointment on July 1st. Pt states he took medication for a sinus infection and the rosacea went away.  Pt states after completing the antibiotics, the rosacea returned.  -Pt also states he has poison ivy on his arm  -Pt states he was out playing disc golf  -Pt states he has had this before and is very allergic to it.    HPI: HPI  Problems:  Patient Active Problem List   Diagnosis Date Noted   Malignant neoplasm of left kidney (HCC) 05/14/2021   Renal mass 04/03/2021   Acute stress disorder 11/28/2020   Dyslipidemia 11/28/2020   Sciatica 11/28/2020   Hyperlipidemia 07/27/2019    Allergies:  Allergies  Allergen Reactions   Bee Venom Anaphylaxis    Other reaction(s): anaphylaxis   Aspirin     Other reaction(s): as a child   Medications:  Current Outpatient Medications:    metroNIDAZOLE (METROGEL) 0.75 % gel, Apply 1 Application topically 2 (two) times daily., Disp: 60 g, Rfl: 0   predniSONE (STERAPRED UNI-PAK 21 TAB) 10 MG (21) TBPK tablet,  Take following package directions., Disp: 21 tablet, Rfl: 0   ALPRAZolam (XANAX) 1 MG tablet, TAKE 1 TABLET BY MOUTH AT BEDTIME AS NEEDED FOR ANXIETY., Disp: 30 tablet, Rfl: 2  Observations/Objective: Patient is well-developed, well-nourished in no acute distress.  Resting comfortably at home.  Head is normocephalic, atraumatic. Erythema and pustules noted to facial area Skin on arm erythematous  No labored breathing.  Speech is clear and coherent with logical content.  Patient is alert and  oriented at baseline.    Assessment and Plan: 1. Rosacea - metroNIDAZOLE (METROGEL) 0.75 % gel; Apply 1 Application topically 2 (two) times daily.  Dispense: 60 g; Refill: 0  2. Poison ivy - predniSONE (STERAPRED UNI-PAK 21 TAB) 10 MG (21) TBPK tablet; Take following package directions.  Dispense: 21 tablet; Refill: 0  -Patient to follow up with dermatology as recommended -Pt to seek urgent care or hospital evaluation for worsening symptoms.  Follow Up Instructions: I discussed the assessment and treatment plan with the patient. The patient was provided an opportunity to ask questions and all were answered. The patient agreed with the plan and demonstrated an understanding of the instructions.  A copy of instructions were sent to the patient via MyChart unless otherwise noted below.     The patient was advised to call back or seek an in-person evaluation if the symptoms worsen or if the condition fails to improve as anticipated.  Time:  I spent 15 minutes with the patient via telehealth technology discussing the above problems/concerns.    Reed Pandy, PA-C

## 2023-03-20 NOTE — Patient Instructions (Signed)
[facility], thank you for joining Reed Pandy, PA-C for today's virtual visit.  While this provider is not your primary care provider (PCP), if your PCP is located in our provider database this encounter information will be shared with them immediately following your visit.   A [facility] MyChart account gives you access to today's visit and all your visits, tests, and labs performed at [facility] " click here if you don't have a [facility] MyChart account or go to [email or web address]  Consent: (Patient) Steve Jimenez provided verbal consent for this virtual visit at the beginning of the encounter.  Current Medications:  Current Outpatient Medications:    metroNIDAZOLE (METROGEL) 0.75 % gel, Apply 1 Application topically 2 (two) times daily., Disp: 60 g, Rfl: 0   predniSONE (STERAPRED UNI-PAK 21 TAB) 10 MG (21) TBPK tablet, Take following package directions., Disp: 21 tablet, Rfl: 0   ALPRAZolam (XANAX) 1 MG tablet, TAKE 1 TABLET BY MOUTH AT BEDTIME AS NEEDED FOR ANXIETY., Disp: 30 tablet, Rfl: 2   Medications ordered in this encounter:  Meds ordered this encounter  Medications   metroNIDAZOLE (METROGEL) 0.75 % gel    Sig: Apply 1 Application topically 2 (two) times daily.    Dispense:  60 g    Refill:  0   predniSONE (STERAPRED UNI-PAK 21 TAB) 10 MG (21) TBPK tablet    Sig: Take following package directions.    Dispense:  21 tablet    Refill:  0    Please dispense one standard blister pack taper.     *If you need refills on other medications prior to your next appointment, please contact your pharmacy*  Follow-Up: Call back or seek an in-person evaluation if the symptoms worsen or if the condition fails to improve as anticipated.  [facility] [phone]  Other Instructions Rosacea Rosacea is a long-term (chronic) condition that affects the skin of the face, including the cheeks, nose, forehead, and chin. This condition can  also affect the eyes. Rosacea causes blood vessels near the surface of the skin to get bigger (be enlarged), and that makes the skin red. What are the causes? The cause of this condition is not known. Certain things can make rosacea worse, including: Exercise. Sunlight. Very hot or cold temperatures. Hot or spicy foods and drinks. Drinking alcohol. Stress. Taking blood pressure medicine. Long-term use of topical steroids on the face. What increases the risk? You are more likely to get this condition if you: Are older than 43 years of age. Are a woman. Have light-colored skin (light complexion). Have a family history of the condition. What are the signs or symptoms?  Redness of the face. Red bumps or pimples on the face. A red, enlarged nose. Blushing easily. Red lines on the skin. Eye problems such as: Irritated, burning, or itchy feeling in the eyes. Swollen eyelids. Drainage from the eyes. Feeling like there is something in your eye. How is this treated? There is no cure for this condition, but treatment can help to control your symptoms. Your doctor may suggest that you see a skin specialist (dermatologist). Treatment may include: Medicines that are put on the skin or taken by mouth (orally). Laser treatment to improve how the skin looks. Surgery. This is rare. Your doctor will also suggest the best way to take care of your skin. Even after your skin gets better, you will likely need to continue treatment to keep your rosacea from coming back. Follow these instructions  at home: Skin care Take care of your skin as told by your doctor. Your doctor may tell you to do these things: Wash your skin gently two or more times each day. Use mild soap. Use a sunscreen or sunblock with SPF 30 or greater. Use gentle cosmetics that are meant for sensitive skin. Shave with an electric shaver instead of a blade. Lifestyle Try to keep track of what foods make this condition worse. Avoid  those foods. These may include: Spicy foods. Seafood. Cheese. Hot liquids. Nuts. Chocolate. Iodized salt. Do not drink alcohol. Avoid very cold or hot temperatures. Try to reduce your stress. If you need help to do this, talk with your doctor. When you exercise, do these things to stay cool: Limit sun exposure to your face. Use a fan. Exercise for a shorter time, and exercise more often. General instructions Take and apply over-the-counter and prescription medicines only as told by your doctor. If you were prescribed antibiotics, apply it or take them as told by your doctor. Do not stop using them even if your condition improves. If your eyelids are affected, hold warm compresses on them. Do this as told by your doctor. Keep all follow-up visits. Contact a doctor if: Your symptoms get worse. Your symptoms do not improve after 2 months of treatment. You have new symptoms. You have any changes in how you see (vision) or you have problems with your eyes, such as redness or itching. You feel very sad (depressed). You do not want to eat as much as normal (lose your appetite). You have trouble focusing your mind (concentrating). Summary Rosacea is a long-term condition that affects the skin of the face, including the cheeks, nose, forehead, and chin. Take care of your skin as told by your doctor. Take and apply medicines only as told by your doctor. Contact a doctor if your symptoms get worse or if you have problems with your eyes. Keep all follow-up visits. This information is not intended to replace advice given to you by your health care provider. Make sure you discuss any questions you have with your health care provider. Document Revised: 11/11/2021 Document Reviewed: 11/11/2021 Elsevier Patient Education  2024 Elsevier Inc.   Poison Ivy Dermatitis Poison ivy dermatitis is redness and soreness of the skin caused by chemicals in the leaves of the poison ivy plant. You may have  very bad itching, swelling, a rash, and blisters. What are the causes? Touching a poison ivy plant. Touching something that has the chemical on it. This may include animals or objects that have come in contact with the plant. What increases the risk? Going outdoors often in wooded or [place] areas. Going outdoors without wearing protective clothing, such as closed shoes, long pants, and a long-sleeved shirt. What are the signs or symptoms?  Skin redness. Very bad itching. A rash that often includes bumps and blisters. The rash usually appears 48 hours after exposure, if you have had it before. If this is the first time you have it, the rash may not appear until a week after exposure. Swelling. This may occur if the reaction is very bad. Symptoms usually last for 1-2 weeks. The first time you get this condition, symptoms may last 3-4 weeks. How is this treated? This condition may be treated with: Hydrocortisone cream or calamine lotion to relieve itching. Oatmeal baths to soothe the skin. Medicines, such as over-the-counter antihistamine tablets. Oral steroid medicine for very bad reactions. Follow these instructions at home: Medicines Take or  apply over-the-counter and prescription medicines only as told by your doctor. Use hydrocortisone cream or calamine lotion as needed to help with itching. General instructions Do not scratch or rub your skin. Put a cold, wet cloth (cold compress) on the affected areas or take baths in cool water. This will help with itching. Avoid hot baths and showers. Take oatmeal baths as needed. Use colloidal oatmeal. You can get this at a pharmacy or grocery store. Follow the instructions on the package. While you have the rash, wash your clothes right after you wear them. Check the affected area every day for signs of infection. Check for: More redness, swelling, or pain. Fluid or blood. Warmth. Pus or a bad smell. Keep all follow-up visits. Your doctor  may want to see how your skin is doing with treatment. How is this prevented?  Know what poison ivy looks like, so you can avoid it. This plant has three leaves with flowering branches on a single stem. The leaves are glossy. The leaves have uneven edges that come to a point. If you touch poison ivy, wash your skin with soap and water right away. Be sure to wash under your fingernails. When hiking or camping, wear long pants, a long-sleeved shirt, long socks, and hiking boots. You can also use a lotion on your skin that helps to prevent contact with poison ivy. If you think that your clothes or outdoor gear came in contact with poison ivy, rinse them off with a garden hose before you bring them inside your house. When doing yard work or gardening, wear gloves, long sleeves, long pants, and boots. Wash your garden tools and gloves if they come in contact with poison ivy. If you think that your pet has come into contact with poison ivy, wash them with pet shampoo and water. Make sure to wear gloves while washing your pet. Contact a doctor if: You have open sores in the rash area. You have any signs of infection. You have redness that spreads past the rash area. You have a fever. You have a rash over a large area of your body. You have a rash on your eyes, mouth, or genitals. Your rash does not get better after a few weeks. Get help right away if: Your face swells or your eyes swell shut. You have trouble breathing. You have trouble swallowing. These symptoms may be an emergency. Do not wait to see if the symptoms will go away. Get help right away. Call 911. This information is not intended to replace advice given to you by your health care provider. Make sure you discuss any questions you have with your health care provider. Document Revised: 02/18/2022 Document Reviewed: 02/18/2022 Elsevier Patient Education  2024 Elsevier Inc.     If you have been instructed to have an in-person  evaluation today at a local Urgent Care facility, please use the link below. It will take you to a list of all of our available [facility], including address, phone number and hours of operation. Please do not delay care.  [facility]  If you or a family member do not have a primary care provider, use the link below to schedule a visit and establish care. When you choose a [employer] primary care physician or advanced practice provider, you gain a long-term partner in health. Find a Primary Care Provider  Learn more about [facility]'s in-office and virtual care options: [facility] Get Care Now

## 2023-03-29 DIAGNOSIS — C642 Malignant neoplasm of left kidney, except renal pelvis: Secondary | ICD-10-CM | POA: Diagnosis not present

## 2023-04-20 ENCOUNTER — Telehealth: Payer: BC Managed Care – PPO | Admitting: Physician Assistant

## 2023-04-20 DIAGNOSIS — L255 Unspecified contact dermatitis due to plants, except food: Secondary | ICD-10-CM

## 2023-04-20 MED ORDER — TRIAMCINOLONE ACETONIDE 0.1 % EX CREA: 1.0000 | TOPICAL_CREAM | Freq: Two times a day (BID) | CUTANEOUS | 0 refills | Status: DC

## 2023-04-20 MED ORDER — PREDNISONE 10 MG PO TABS: ORAL_TABLET | ORAL | 0 refills | Status: DC

## 2023-04-20 NOTE — Progress Notes (Signed)
E-Visit for [employer]  We are sorry that you are not feeing well.  Here is how we plan to help!  Based on what you have shared with me it looks like you have had an allergic reaction to the oily resin from a group of plants.  This resin is very sticky, so it easily attaches to your skin, clothing, tools equipment, and pet's fur.    This blistering rash is often called poison ivy rash although it can come from contact with the leaves, stems and roots of poison ivy, poison oak and poison sumac.  The oily resin contains urushiol (u-ROO-she-ol) that produces a skin rash on exposed skin.  The severity of the rash depends on the amount of urushiol that gets on your skin.  A section of skin with more urushiol on it may develop a rash sooner.  The rash usually develops 12-48 hours after exposure and can last two to three weeks.  Your skin must come in direct contact with the plant's oil to be affected.  Blister fluid doesn't spread the rash.  However, if you come into contact with a piece of clothing or pet fur that has urushiol on it, the rash may spread out.  You can also transfer the oil to other parts of your body with your fingers.  Often the rash looks like a straight line because of the way the plant brushes against your skin.  Since your rash is widespread or has resulted in a large number of blisters, I have prescribed an oral corticosteroid.  Please follow these recommendations:  I have sent a prednisone dose pack to your chosen pharmacy. Be sure to follow the instructions carefully and complete the entire prescription. You may use Benadryl or Caladryl topical lotions to sooth the itch and remember cool, not hot, showers and baths can help relieve the itching!  Place cool, wet compresses on the affected area for 15-30 minutes several times a day.  You may also take oral antihistamines, such as diphenhydramine (Benadryl, others), which may also help you sleep better.  Watch your skin for any purulent  (pus) drainage or red streaking from the site.  If this occurs, contact your provider.  You may require an antibiotic for a skin infection.  Make sure that the clothes you were wearing as well as any towels or sheets that may have come in contact with the oil (urushiol) are washed in detergent and hot water.       I have developed the following plan to treat your condition I am prescribing a two week course of steroids (37 tablets of 10 mg prednisone).  Days 1-4 take 4 tablets (40 mg) daily  Days 5-8 take 3 tablets (30 mg) daily, Days 9-11 take 2 tablets (20 mg) daily, Days 12-14 take 1 tablet (10 mg) daily.  I have also prescribed Triamcinolone cream to apply topically.   What can you do to prevent this rash?  Avoid the plants.  Learn how to identify poison ivy, poison oak and poison sumac in all seasons.  When hiking or engaging in other activities that might expose you to these plants, try to stay on cleared pathways.  If camping, make sure you pitch your tent in an area free of these plants.  Keep pets from running through wooded areas so that urushiol doesn't accidentally stick to their fur, which you may touch.  Remove or kill the plants.  In your yard, you can get rid of poison ivy  by applying an herbicide or pulling it out of the ground, including the roots, while wearing heavy gloves.  Afterward remove the gloves and thoroughly wash them and your hands.  Don't burn poison ivy or related plants because the urushiol can be carried by smoke.  Wear protective clothing.  If needed, protect your skin by wearing socks, boots, pants, long sleeves and vinyl gloves.  Wash your skin right away.  Washing off the oil with soap and water within 30 minutes of exposure may reduce your chances of getting a poison ivy rash.  Even washing after an hour or so can help reduce the severity of the rash.  If you walk through some poison ivy and then later touch your shoes, you may get some urushiol on your hands,  which may then transfer to your face or body by touching or rubbing.  If the contaminated object isn't cleaned, the urushiol on it can still cause a skin reaction years later.    Be careful not to reuse towels after you have washed your skin.  Also carefully wash clothing in detergent and hot water to remove all traces of the oil.  Handle contaminated clothing carefully so you don't transfer the urushiol to yourself, furniture, rugs or appliances.  Remember that pets can carry the oil on their fur and paws.  If you think your pet may be contaminated with urushiol, put on some long rubber gloves and give your pet a bath.  Finally, be careful not to burn these plants as the smoke can contain traces of the oil.  Inhaling the smoke may result in difficulty breathing. If that occurred you should see a physician as soon as possible.  See your doctor right away if:  The reaction is severe or widespread You inhaled the smoke from burning poison ivy and are having difficulty breathing Your skin continues to swell The rash affects your eyes, mouth or genitals Blisters are oozing pus You develop a fever greater than 100 F (37.8 C) The rash doesn't get better within a few weeks.  If you scratch the poison ivy rash, bacteria under your fingernails may cause the skin to become infected.  See your doctor if pus starts oozing from the blisters.  Treatment generally includes antibiotics.  Poison ivy treatments are usually limited to self-care methods.  And the rash typically goes away on its own in two to three weeks.     If the rash is widespread or results in a large number of blisters, your doctor may prescribe an oral corticosteroid, such as prednisone.  If a bacterial infection has developed at the rash site, your doctor may give you a prescription for an oral antibiotic.  MAKE SURE YOU  Understand these instructions. Will watch your condition. Will get help right away if you are not doing well or  get worse.   Thank you for choosing an e-visit.  Your e-visit answers were reviewed by a board certified advanced clinical practitioner to complete your personal care plan. Depending upon the condition, your plan could have included both over the counter or prescription medications.  Please review your pharmacy choice. Make sure the pharmacy is open so you can pick up prescription now. If there is a problem, you may contact your provider through [employer] and have the prescription routed to another pharmacy.  Your safety is important to [place]. If you have drug allergies check your prescription carefully.   For the next 24 hours you can use MyChart  to ask questions about today's visit, request a non-urgent call back, or ask for a work or school excuse. You will get an email in the next two days asking about your experience. I hope that your e-visit has been valuable and will speed your recovery.    I have spent 5 minutes in review of e-visit questionnaire, review and updating patient chart, medical decision making and response to patient.   Margaretann Loveless, PA-C

## 2023-04-24 ENCOUNTER — Telehealth: Payer: BC Managed Care – PPO | Admitting: Nurse Practitioner

## 2023-04-24 DIAGNOSIS — L237 Allergic contact dermatitis due to plants, except food: Secondary | ICD-10-CM

## 2023-04-25 NOTE — Progress Notes (Signed)
Tenn[facility],   Since this has been going on for awhile and it is your second round of steroids this summer for poison [place] I would like you to see your primary care provider this week so they can assess the rash and circulation  I feel your condition warrants further evaluation and I recommend that you be seen for a face to face visit.  Please contact your primary care physician practice to be seen. Many offices offer virtual options to be seen via video if you are not comfortable going in person to a medical facility at this time.  NOTE: You will NOT be charged for this eVisit.  If you do not have a PCP, [facility] offers a free physician referral service available at [phone]. Our trained staff has the experience, knowledge and resources to put you in touch with a physician who is right for you.    If you are having a true medical emergency please call 911.   Your e-visit answers were reviewed by a board certified advanced clinical practitioner to complete your personal care plan.  Thank you for using e-Visits.

## 2023-04-28 ENCOUNTER — Ambulatory Visit (INDEPENDENT_AMBULATORY_CARE_PROVIDER_SITE_OTHER): Payer: BC Managed Care – PPO | Admitting: Dermatology

## 2023-04-28 ENCOUNTER — Encounter: Payer: Self-pay | Admitting: Dermatology

## 2023-04-28 VITALS — BP 125/81 | HR 50

## 2023-04-28 DIAGNOSIS — L719 Rosacea, unspecified: Secondary | ICD-10-CM

## 2023-04-28 MED ORDER — DOXYCYCLINE HYCLATE 100 MG PO CAPS: ORAL_CAPSULE | ORAL | 2 refills | Status: DC

## 2023-04-28 NOTE — Patient Instructions (Addendum)
Hello Harley,  Thank you for visiting my office today. I appreciate your commitment to addressing and managing your skin condition. Based on our discussion, here are the key instructions and recommendations to help manage your rosacea:  Rosacea Treatment Plan - Continue using Metrogel: Apply at least once daily, preferably twice, to maintain skin clarity and prevent flare-ups.  - Laboratory Test: We will conduct an ANA test with reflexive titers to rule out lupus, given the symptoms you've described.  - Sun Protection: Use La Roche-Posay UV Toleriane sunscreen, samples of which were provided today. Regular application is crucial to protect your skin.  - Doxycycline Prescription: A prescription for doxycycline has been provided for you to use in case of a severe flare. Take it twice daily with food for five days during such instances. Keep this medication on hand, especially when traveling.  - Follow-Up Appointment: We will schedule a follow-up in three months to assess your condition and the effectiveness of the treatment plan.  - Lifestyle Considerations: Continue to monitor potential triggers such as sun exposure, red wine, spicy foods, and stress.  Please ensure to follow these instructions carefully and do not hesitate to contact our office if you have any questions or if you experience any significant changes in your symptoms.    Due to recent changes in healthcare laws, you may see results of your pathology and/or laboratory studies on MyChart before the doctors have had a chance to review them. We understand that in some cases there may be results that are confusing or concerning to you. Please understand that not all results are received at the same time and often the doctors may need to interpret multiple results in order to provide you with the best plan of care or course of treatment. Therefore, we ask that you please give [place] 2 business days to thoroughly review all your results  before contacting the office for clarification. Should we see a critical lab result, you will be contacted sooner.   If You Need Anything After Your Visit  If you have any questions or concerns for your doctor, please call our main line at [phone] If no one answers, please leave a voicemail as directed and we will return your call as soon as possible. Messages left after 4 pm will be answered the following business day.   You may also send [place] a message via MyChart. We typically respond to MyChart messages within 1-2 business days.  For prescription refills, please ask your pharmacy to contact our office. Our fax number is [phone].  If you have an urgent issue when the clinic is closed that cannot wait until the next business day, you can page your doctor at the number below.    Please note that while we do our best to be available for urgent issues outside of office hours, we are not available 24/7.   If you have an urgent issue and are unable to reach [place], you may choose to seek medical care at your doctor's office, retail clinic, urgent care center, or emergency room.  If you have a medical emergency, please immediately call 911 or go to the emergency department. In the event of inclement weather, please call our main line at [phone] for an update on the status of any delays or closures.  Dermatology Medication Tips: Please keep the boxes that topical medications come in in order to help keep track of the instructions about where and how to use these. Pharmacies typically print the  medication instructions only on the boxes and not directly on the medication tubes.   If your medication is too expensive, please contact our office at [phone] or send [place] a message through MyChart.   We are unable to tell what your co-pay for medications will be in advance as this is different depending on your insurance coverage. However, we may be able to find a substitute medication at  lower cost or fill out paperwork to get insurance to cover a needed medication.   If a prior authorization is required to get your medication covered by your insurance company, please allow [place] 1-2 business days to complete this process.  Drug prices often vary depending on where the prescription is filled and some pharmacies may offer cheaper prices.  The website www.goodrx.com contains coupons for medications through different pharmacies. The prices here do not account for what the cost may be with help from insurance (it may be cheaper with your insurance), but the website can give you the price if you did not use any insurance.  - You can print the associated coupon and take it with your prescription to the pharmacy.  - You may also stop by our office during regular business hours and pick up a GoodRx coupon card.  - If you need your prescription sent electronically to a different pharmacy, notify our office through [facility] or by phone at [phone]

## 2023-04-28 NOTE — Progress Notes (Signed)
   New Patient Visit   Subjective  Steve Jimenez is a 42 y.o. male who presents for the following: rosacea   Patient states he has rosacea located on the face that he would like to have examined. Patient reports the areas have been there for several months. He reports the areas are bothersome. He states that his cheeks became painful at the worst of it. Patient reports he has previously been treated for these areas. He was given Metrogel which cleared it up after about 2 weeks and then he stopped after 3 weeks. He currently washes with a Neutragena face wash but not doing anything topically. Patient has no Hx of skin cancer. Patient has no family history of skin cancer(s).    The following portions of the chart were reviewed this encounter and updated as appropriate: medications, allergies, medical history  Review of Systems:  No other skin or systemic complaints except as noted in HPI or Assessment and Plan.  Objective  Well appearing patient in no apparent distress; mood and affect are within normal limits.   A focused examination was performed of the following areas: face  Relevant exam findings are noted in the Assessment and Plan.   Exam Mid face erythema with telangiectasias +/- scattered inflammatory papules          Assessment & Plan     ROSACEA   Well controlled vs not at goal vs flare  Rosacea is a chronic progressive skin condition usually affecting the face of adults, causing redness and/or acne bumps. It is treatable but not curable. It sometimes affects the eyes (ocular rosacea) as well. It may respond to topical and/or systemic medication and can flare with stress, sun exposure, alcohol, exercise, topical steroids (including hydrocortisone/cortisone 10) and some foods.  Daily application of broad spectrum spf 30+ sunscreen to face is recommended to reduce flares.  Patient denies grittiness of the eyes  Treatment Plan - Will get ANA  -Continue  Metrogel to face daily -Doxycycline 100mg  bid for 5 days if flaring Rosacea  Related Procedures ANA,IFA RA Diag Pnl w/rflx Tit/Patn   Samples of LaRoche Posay UV tolereine  Return in about 3 months (around 07/29/2023) for Rosacea f/u.  [name], CMA, am acting as scribe for [name], DO.   Documentation: I have reviewed the above documentation for accuracy and completeness, and I agree with the above.  [name], DO

## 2023-04-29 ENCOUNTER — Other Ambulatory Visit: Payer: Self-pay

## 2023-04-29 DIAGNOSIS — L719 Rosacea, unspecified: Secondary | ICD-10-CM | POA: Diagnosis not present

## 2023-05-11 NOTE — Progress Notes (Signed)
Good news, your screening test for Lupus came back negative.  You will continue your current treatment plan and keep your follow up appointment so we can discuss next steps.  Best,  Dr. Onalee Hua

## 2023-06-09 ENCOUNTER — Other Ambulatory Visit: Payer: Self-pay

## 2023-06-09 MED ORDER — METRONIDAZOLE 1 % EX GEL: CUTANEOUS | 5 refills | Status: DC

## 2023-06-09 NOTE — Progress Notes (Signed)
Pt called requesting refill of his metrogel which was discussed at his ov. I sent it in and informed pt

## 2023-06-10 ENCOUNTER — Ambulatory Visit: Payer: BC Managed Care – PPO | Admitting: Adult Health

## 2023-06-10 ENCOUNTER — Encounter: Payer: Self-pay | Admitting: Adult Health

## 2023-06-10 DIAGNOSIS — G4700 Insomnia, unspecified: Secondary | ICD-10-CM | POA: Diagnosis not present

## 2023-06-10 DIAGNOSIS — F411 Generalized anxiety disorder: Secondary | ICD-10-CM

## 2023-06-10 MED ORDER — HYDROXYZINE HCL 25 MG PO TABS: ORAL_TABLET | ORAL | 5 refills | Status: DC

## 2023-06-10 MED ORDER — ALPRAZOLAM 1 MG PO TABS: ORAL_TABLET | ORAL | 2 refills | Status: AC

## 2023-06-10 NOTE — Progress Notes (Signed)
Steve Jimenez [id number] 1980-12-19 42 y.o.  Subjective:   Patient ID:  Steve Jimenez is a 42 y.o. (DOB 1981/02/07) male.  Chief Complaint: No chief complaint on file.   HPI Steve Jimenez presents to the office today for follow-up of anxiety.  Describes mood today as "ok". Pleasant. Mood symptoms - denies depression and irritability. Reports anxiety - "a little bit at times". Denies recent panic attacks. Denies worry, rumination and over thinking. Mood is consistent. Stating "I feel like I'm doing ok". Feels like Xanax continues to work well when needed. Stable interest and motivation.  Energy levels stable. Active, does not have a regular exercise routine.  Enjoys some usuaIl interests and activities. Married. Lives with wife and 4 children. Parents local. Spending time with family. Appetite adequate. Weight gain - 220 pounds. Reports sleep issues. Averages 4.5 hours - willing to consider mild sleep aid. Focus and concentration stable. Completing tasks. Managing aspects of household. Works full-time - Environmental education officer - traveling internationally - upcoming trip to [place]. Denies SI or HI.  Denies AH or VH. Denies self harm. Denies substance.   Previous medication trials: Xanax    Flowsheet Row ED from 08/03/2021 in [facility] Testing 60 from 03/27/2021 in [facility]  C-SSRS RISK CATEGORY Error: Question 6 not populated No Risk        Review of Systems:  Review of Systems  Musculoskeletal:  Negative for gait problem.  Neurological:  Negative for tremors.  Psychiatric/Behavioral:         Please refer to HPI    Medications: I have reviewed the patient's current medications.  Current Outpatient Medications  Medication Sig Dispense Refill   ALPRAZolam (XANAX) 1 MG tablet TAKE 1 TABLET BY MOUTH AT BEDTIME AS NEEDED FOR ANXIETY. 30 tablet 2   doxycycline (VIBRAMYCIN) 100 MG capsule Take  twice a day for 5 days if flaring 30 capsule 2   metroNIDAZOLE (METROGEL) 1 % gel Apply to face daily 60 g 5   predniSONE (DELTASONE) 10 MG tablet Days 1-4 take 4 tablets (40 mg) daily  Days 5-8 take 3 tablets (30 mg) daily, Days 9-11 take 2 tablets (20 mg) daily, Days 12-14 take 1 tablet (10 mg) daily. 37 tablet 0   triamcinolone cream (KENALOG) 0.1 % Apply 1 Application topically 2 (two) times daily. 30 g 0   No current facility-administered medications for this visit.    Medication Side Effects: None  Allergies:  Allergies  Allergen Reactions   Bee Venom Anaphylaxis    Other reaction(s): anaphylaxis   Aspirin     Other reaction(s): as a child    Past Medical History:  Diagnosis Date   Anginal pain (HCC) 10/2019   due to anxitey   Anxiety     Past Medical History, Surgical history, Social history, and Family history were reviewed and updated as appropriate.   Please see review of systems for further details on the patient's review from today.   Objective:   Physical Exam:  There were no vitals taken for this visit.  Physical Exam Constitutional:      General: He is not in acute distress. Musculoskeletal:        General: No deformity.  Neurological:     Mental Status: He is alert and oriented to person, place, and time.     Coordination: Coordination normal.  Psychiatric:        Attention and Perception: Attention and perception normal.  He does not perceive auditory or visual hallucinations.        Mood and Affect: Mood normal. Mood is not anxious or depressed. Affect is not labile, blunt, angry or inappropriate.        Speech: Speech normal.        Behavior: Behavior normal.        Thought Content: Thought content normal. Thought content is not paranoid or delusional. Thought content does not include homicidal or suicidal ideation. Thought content does not include homicidal or suicidal plan.        Cognition and Memory: Cognition and memory normal.        Judgment:  Judgment normal.     Comments: Insight intact     Lab Review:     Component Value Date/Time   NA 135 03/10/2022 1109   K 4.1 03/10/2022 1109   CL 107 03/10/2022 1109   CO2 22 03/10/2022 1109   GLUCOSE 100 (H) 03/10/2022 1109   BUN 17 03/10/2022 1109   CREATININE 1.07 03/10/2022 1109   CALCIUM 10.2 03/10/2022 1109   PROT 8.6 (H) 03/10/2022 1109   ALBUMIN 4.5 03/10/2022 1109   AST 38 03/10/2022 1109   ALT 57 (H) 03/10/2022 1109   ALKPHOS 83 03/10/2022 1109   BILITOT 0.4 03/10/2022 1109   GFRNONAA >60 03/10/2022 1109       Component Value Date/Time   WBC 4.9 03/10/2022 1109   WBC 11.6 (H) 04/04/2021 0515   RBC 5.07 03/10/2022 1109   HGB 15.8 03/10/2022 1109   HCT 44.5 03/10/2022 1109   PLT 258 03/10/2022 1109   MCV 87.8 03/10/2022 1109   MCH 31.2 03/10/2022 1109   MCHC 35.5 03/10/2022 1109   RDW 11.9 03/10/2022 1109   LYMPHSABS 1.2 03/10/2022 1109   MONOABS 0.4 03/10/2022 1109   EOSABS 0.3 03/10/2022 1109   BASOSABS 0.0 03/10/2022 1109    No results found for: "POCLITH", "LITHIUM"   No results found for: "PHENYTOIN", "PHENOBARB", "VALPROATE", "CBMZ"   .res Assessment: Plan:    Plan:  PDMP reviewed  Xanax 1mg  daily PRN anxiety  Add Hydroxyzine 25mg  - 1 to 2 at bedtime    RTC 6 months - may call for prescription between visits.  Patient advised to contact office with any questions, adverse effects, or acute worsening in signs and symptoms.  Discussed potential benefits, risk, and side effects of benzodiazepines to include potential risk of tolerance and dependence, as well as possible drowsiness.  Advised patient not to drive if experiencing drowsiness and to take lowest possible effective dose to minimize risk of dependence and tolerance.   There are no diagnoses linked to this encounter.   Please see After Visit Summary for patient specific instructions.  Future Appointments  Date Time Provider Department Center  06/10/2023  9:40 AM Rayola Everhart, Thereasa Solo, NP [facility] None  07/28/2023  8:45 AM Terri Piedra, DO [facility] None    No orders of the defined types were placed in this encounter.   -------------------------------

## 2023-06-11 ENCOUNTER — Other Ambulatory Visit: Payer: Self-pay

## 2023-07-02 ENCOUNTER — Other Ambulatory Visit: Payer: Self-pay | Admitting: Adult Health

## 2023-07-02 DIAGNOSIS — G4700 Insomnia, unspecified: Secondary | ICD-10-CM

## 2023-07-28 ENCOUNTER — Ambulatory Visit: Payer: BC Managed Care – PPO | Admitting: Dermatology

## 2023-08-07 ENCOUNTER — Other Ambulatory Visit: Payer: Self-pay

## 2023-09-13 DIAGNOSIS — D49512 Neoplasm of unspecified behavior of left kidney: Secondary | ICD-10-CM | POA: Diagnosis not present

## 2023-09-26 DIAGNOSIS — C642 Malignant neoplasm of left kidney, except renal pelvis: Secondary | ICD-10-CM | POA: Diagnosis not present

## 2023-11-03 DIAGNOSIS — J0191 Acute recurrent sinusitis, unspecified: Secondary | ICD-10-CM | POA: Insufficient documentation

## 2023-12-05 ENCOUNTER — Ambulatory Visit: Payer: BC Managed Care – PPO | Admitting: Adult Health

## 2023-12-23 ENCOUNTER — Other Ambulatory Visit: Payer: Self-pay

## 2023-12-23 ENCOUNTER — Ambulatory Visit: Payer: BC Managed Care – PPO | Admitting: Adult Health

## 2023-12-29 ENCOUNTER — Other Ambulatory Visit: Payer: Self-pay | Admitting: Adult Health

## 2023-12-29 DIAGNOSIS — G4700 Insomnia, unspecified: Secondary | ICD-10-CM

## 2024-01-18 ENCOUNTER — Ambulatory Visit (INDEPENDENT_AMBULATORY_CARE_PROVIDER_SITE_OTHER): Payer: Self-pay | Admitting: Adult Health

## 2024-01-18 DIAGNOSIS — Z0389 Encounter for observation for other suspected diseases and conditions ruled out: Secondary | ICD-10-CM

## 2024-01-18 NOTE — Progress Notes (Signed)
 Patient no show appointment. ? ?

## 2024-02-15 ENCOUNTER — Other Ambulatory Visit: Payer: Self-pay

## 2024-02-15 ENCOUNTER — Encounter (HOSPITAL_COMMUNITY): Payer: Self-pay

## 2024-02-15 ENCOUNTER — Ambulatory Visit (HOSPITAL_COMMUNITY)
Admission: RE | Admit: 2024-02-15 | Discharge: 2024-02-15 | Disposition: A | Discharge status: Home / Self Care | Source: Ambulatory Visit | Attending: Emergency Medicine | Admitting: Emergency Medicine

## 2024-02-15 ENCOUNTER — Ambulatory Visit (HOSPITAL_COMMUNITY): Payer: Self-pay

## 2024-02-15 VITALS — BP 135/89 | HR 51 | Temp 97.9°F | Resp 18

## 2024-02-15 DIAGNOSIS — K30 Functional dyspepsia: Secondary | ICD-10-CM | POA: Insufficient documentation

## 2024-02-15 DIAGNOSIS — R1012 Left upper quadrant pain: Secondary | ICD-10-CM | POA: Insufficient documentation

## 2024-02-15 LAB — CBC
HCT: 46.4 % (ref 39.0–52.0)
Hemoglobin: 15.7 g/dL (ref 13.0–17.0)
MCH: 30.3 pg (ref 26.0–34.0)
MCHC: 33.8 g/dL (ref 30.0–36.0)
MCV: 89.4 fL (ref 80.0–100.0)
Platelets: 229 10*3/uL (ref 150–400)
RBC: 5.19 MIL/uL (ref 4.22–5.81)
RDW: 12.4 % (ref 11.5–15.5)
WBC: 3.9 10*3/uL — ABNORMAL LOW (ref 4.0–10.5)
nRBC: 0 % (ref 0.0–0.2)

## 2024-02-15 LAB — COMPREHENSIVE METABOLIC PANEL WITH GFR
ALT: 30 U/L (ref 0–44)
AST: 29 U/L (ref 15–41)
Albumin: 3.8 g/dL (ref 3.5–5.0)
Alkaline Phosphatase: 74 U/L (ref 38–126)
Anion gap: 9 (ref 5–15)
BUN: 15 mg/dL (ref 6–20)
CO2: 22 mmol/L (ref 22–32)
Calcium: 9.4 mg/dL (ref 8.9–10.3)
Chloride: 104 mmol/L (ref 98–111)
Creatinine, Ser: 0.99 mg/dL (ref 0.61–1.24)
GFR, Estimated: >60 mL/min (ref >60)
Glucose, Bld: 102 mg/dL — ABNORMAL HIGH (ref 70–99)
Potassium: 4.1 mmol/L (ref 3.5–5.1)
Sodium: 135 mmol/L (ref 135–145)
Total Bilirubin: 0.7 mg/dL (ref 0.0–1.2)
Total Protein: 7.9 g/dL (ref 6.5–8.1)

## 2024-02-15 LAB — LIPASE, BLOOD: Lipase: 39 U/L (ref 11–51)

## 2024-02-15 MED ORDER — PANTOPRAZOLE SODIUM 20 MG PO TBEC: 20.0000 mg | DELAYED_RELEASE_TABLET | Freq: Every day | ORAL | 0 refills | Status: DC

## 2024-02-15 NOTE — Discharge Instructions (Signed)
 Start taking pantoprazole once daily.  This may help your abdominal discomfort if it is related to indigestion. We have drawn some labs today and these will come back later today, if there are any concerning results we will give you a call and advise any additional treatment. Keep your appointment with your GI doctor on the 29th. Follow-up with your primary care doctor if needed. Return here as needed. If you develop persistent severe abdominal pain, excessive vomiting, blood in stool, or fevers please seek immediate medical treatment in the emergency department.

## 2024-02-15 NOTE — ED Triage Notes (Signed)
 Pt describes injury several weeks ago while playing disc golf -- felt a "pop" when he twisted to the left and then couldn't twist to left side for weeks; since then, c/o sharp radiating pain in LUQ "only when I eat". States feels like pain is beneath ribcage but has difficulty pinpoint it; feels like it radiates outward and occasionally feels it in back. More persistent over past 1.5 weeks. Irreg BMs - last BM this AM but small amount.

## 2024-02-15 NOTE — ED Provider Notes (Signed)
 [facility]    CSN: [id number] Arrival date & time: 02/15/24  7628      History   Chief Complaint Chief Complaint  Patient presents with   Abdominal Pain    Left right at ribcage. Dull Burning, tingling pain. - Entered by patient    HPI Steve Jimenez is a 43 y.o. male.   Patient presents with concerns for left upper quadrant pain with eating x 3 to 4 months.  Patient describes the pain as burning and states that sometimes it will radiate shooting pain across his abdomen.  Patient states that the pain only occurs when eating.  Patient states that sometimes he feels the pain radiate to his back as well.  Patient states that the pain has been occurring more often and is starting to become worse over the last week and a half.  Patient states that he has an appointment with a GI doctor on 5/29, but wanted to be evaluated sooner to make sure there is nothing more serious going on.  Patient reports intermittent loose stools as well.  Patient states he has a history of regular bowel movements.  Last BM was this morning but it was a small amount and was loose.  Denies nausea, vomiting, persistent diarrhea, blood in the stool, persistent abdominal pain, flank pain, urinary symptoms, and fever.  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    The history is provided by the patient and medical records.  Abdominal Pain   Past Medical History:  Diagnosis Date   Anginal pain (HCC) 10/2019   due to anxitey   Anxiety     Patient Active Problem List   Diagnosis Date Noted   Malignant neoplasm of left kidney (HCC) 05/14/2021   Renal mass 04/03/2021   Acute stress disorder 11/28/2020   Dyslipidemia 11/28/2020   Sciatica 11/28/2020   Hyperlipidemia 07/27/2019    Past Surgical History:  Procedure Laterality Date   APPENDECTOMY     age 43   ROBOT ASSISTED LAPAROSCOPIC NEPHRECTOMY Left 04/03/2021   Procedure: XI ROBOTIC ASSISTED LAPAROSCOPIC  NEPHRECTOMY;  Surgeon: Adelbert Homans, MD;  Location: [facility];  Service: Urology;  Laterality: Left;       Home Medications    Prior to Admission medications   Medication Sig Start Date End Date Taking? Authorizing Provider  ALPRAZolam  (XANAX ) 1 MG tablet TAKE 1 TABLET BY MOUTH AT BEDTIME AS NEEDED FOR ANXIETY. 06/10/23  Yes Mozingo, Regina Nattalie, NP  doxycycline  (VIBRAMYCIN ) 100 MG capsule Take twice a day for 5 days if flaring 04/28/23  Yes Dellar Fenton, DO  pantoprazole (PROTONIX) 20 MG tablet Take 1 tablet (20 mg total) by mouth daily. 02/15/24  Yes Karon Packer, NP    Family History Family History  Problem Relation Age of Onset   Hypertension Mother    Healthy Father     Social History Social History   Tobacco Use   Smoking status: Former    Current packs/day: 0.00    Types: Cigarettes    Start date: 1994    Quit date: 2004    Years since quitting: 21.3   Smokeless tobacco: Never  Vaping Use   Vaping status: Never Used  Substance Use Topics   Alcohol use: Yes    Alcohol/week: 4.0 standard drinks of alcohol    Types: 4 Standard drinks or equivalent per week   Drug use: Never     Allergies   Bee venom and Aspirin   Review of Systems Review of Systems  Gastrointestinal:  Positive for abdominal pain.   Per HPI  Physical Exam Triage Vital Signs ED Triage Vitals  Encounter Vitals Group     BP 02/15/24 0930 135/89     Systolic BP Percentile --      Diastolic BP Percentile --      Pulse Rate 02/15/24 0930 (!) 51     Resp 02/15/24 0930 18     Temp 02/15/24 0930 97.9 F (36.6 C)     Temp Source 02/15/24 0930 Oral     SpO2 02/15/24 0930 96 %     Weight --      Height --      Head Circumference --      Peak Flow --      Pain Score 02/15/24 0935 1     Pain Loc --      Pain Education --      Exclude from Growth Chart --    No data found.  Updated Vital Signs BP 135/89   Pulse (!) 51   Temp 97.9 F (36.6 C) (Oral)   Resp 18    SpO2 96%   Visual Acuity Right Eye Distance:   Left Eye Distance:   Bilateral Distance:    Right Eye Near:   Left Eye Near:    Bilateral Near:     Physical Exam Vitals and nursing note reviewed.  Constitutional:      General: He is awake. He is not in acute distress.    Appearance: Normal appearance. He is well-developed and well-groomed. He is not ill-appearing.  Abdominal:     General: Abdomen is protuberant. Bowel sounds are normal. There is no distension.     Palpations: Abdomen is soft.     Tenderness: There is no abdominal tenderness. There is no right CVA tenderness, left CVA tenderness, guarding or rebound. Negative signs include Murphy's sign.     Hernia: No hernia is present.  Skin:    General: Skin is warm and dry.  Neurological:     Mental Status: He is alert.  Psychiatric:        Behavior: Behavior is cooperative.      UC Treatments / Results  Labs (all labs ordered are listed, but only abnormal results are displayed) Labs Reviewed  CBC  COMPREHENSIVE METABOLIC PANEL WITH GFR  LIPASE, BLOOD    EKG   Radiology No results found.  Procedures Procedures (including critical care time)  Medications Ordered in UC Medications - No data to display  Initial Impression / Assessment and Plan / UC Course  I have reviewed the triage vital signs and the nursing notes.  Pertinent labs & imaging results that were available during my care of the patient were reviewed by me and considered in my medical decision making (see chart for details).     Patient is well-appearing.  Vitals are stable.  No significant findings upon assessment.  Nontender upon palpation of abdomen.  Abdomen is soft without distention or presence of mass or hernia.  Prescribed pantoprazole to start daily.  Ordered CBC, CMP, and lipase to rule out underlying causes for abdominal pain.  Discussed follow-up, return, and strict ER precautions. Final Clinical Impressions(s) / UC Diagnoses    Final diagnoses:  Left upper quadrant abdominal pain  Indigestion     Discharge Instructions      Start taking pantoprazole once daily.  This may help your abdominal discomfort if it is related to indigestion. We  have drawn some labs today and these will come back later today, if there are any concerning results we will give you a call and advise any additional treatment. Keep your appointment with your GI doctor on the 29th. Follow-up with your primary care doctor if needed. Return here as needed. If you develop persistent severe abdominal pain, excessive vomiting, blood in stool, or fevers please seek immediate medical treatment in the emergency department.  ED Prescriptions     Medication Sig Dispense Auth. Provider   pantoprazole (PROTONIX) 20 MG tablet Take 1 tablet (20 mg total) by mouth daily. 30 tablet Levora Reas A, NP      PDMP not reviewed this encounter.   Levora Reas A, NP 02/15/24 1005

## 2024-02-19 ENCOUNTER — Other Ambulatory Visit: Payer: Self-pay

## 2024-03-01 ENCOUNTER — Ambulatory Visit
Admission: RE | Admit: 2024-03-01 | Discharge: 2024-03-01 | Disposition: A | Discharge status: Home / Self Care | Source: Ambulatory Visit | Attending: Student

## 2024-03-01 ENCOUNTER — Other Ambulatory Visit: Payer: Self-pay | Admitting: Student

## 2024-03-01 ENCOUNTER — Encounter: Payer: Self-pay | Admitting: Urology

## 2024-03-01 DIAGNOSIS — K921 Melena: Secondary | ICD-10-CM

## 2024-03-01 DIAGNOSIS — K219 Gastro-esophageal reflux disease without esophagitis: Secondary | ICD-10-CM | POA: Diagnosis not present

## 2024-03-01 DIAGNOSIS — R109 Unspecified abdominal pain: Secondary | ICD-10-CM | POA: Diagnosis not present

## 2024-03-01 DIAGNOSIS — Z85528 Personal history of other malignant neoplasm of kidney: Secondary | ICD-10-CM | POA: Diagnosis not present

## 2024-03-01 DIAGNOSIS — K5900 Constipation, unspecified: Secondary | ICD-10-CM | POA: Diagnosis not present

## 2024-03-01 DIAGNOSIS — R519 Headache, unspecified: Secondary | ICD-10-CM | POA: Diagnosis not present

## 2024-03-01 DIAGNOSIS — H53143 Visual discomfort, bilateral: Secondary | ICD-10-CM | POA: Diagnosis not present

## 2024-03-02 ENCOUNTER — Ambulatory Visit (HOSPITAL_COMMUNITY)

## 2024-03-03 ENCOUNTER — Ambulatory Visit (HOSPITAL_COMMUNITY)

## 2024-03-04 ENCOUNTER — Ambulatory Visit (HOSPITAL_COMMUNITY)

## 2024-03-05 ENCOUNTER — Encounter: Payer: Self-pay | Admitting: Urology

## 2024-03-05 DIAGNOSIS — C642 Malignant neoplasm of left kidney, except renal pelvis: Secondary | ICD-10-CM

## 2024-03-08 DIAGNOSIS — R1012 Left upper quadrant pain: Secondary | ICD-10-CM | POA: Diagnosis not present

## 2024-03-08 DIAGNOSIS — K2970 Gastritis, unspecified, without bleeding: Secondary | ICD-10-CM | POA: Diagnosis not present

## 2024-03-08 DIAGNOSIS — K2930 Chronic superficial gastritis without bleeding: Secondary | ICD-10-CM | POA: Diagnosis not present

## 2024-03-08 DIAGNOSIS — K219 Gastro-esophageal reflux disease without esophagitis: Secondary | ICD-10-CM | POA: Diagnosis not present

## 2024-03-08 DIAGNOSIS — K625 Hemorrhage of anus and rectum: Secondary | ICD-10-CM | POA: Diagnosis not present

## 2024-03-08 DIAGNOSIS — K2289 Other specified disease of esophagus: Secondary | ICD-10-CM | POA: Diagnosis not present

## 2024-03-09 ENCOUNTER — Other Ambulatory Visit: Payer: Self-pay | Admitting: Urology

## 2024-03-09 ENCOUNTER — Ambulatory Visit (HOSPITAL_COMMUNITY)
Admission: RE | Admit: 2024-03-09 | Discharge: 2024-03-09 | Disposition: A | Discharge status: Home / Self Care | Source: Ambulatory Visit | Attending: Emergency Medicine | Admitting: Emergency Medicine

## 2024-03-09 ENCOUNTER — Encounter (HOSPITAL_COMMUNITY): Payer: Self-pay

## 2024-03-09 VITALS — BP 138/95 | HR 78 | Temp 98.1°F | Resp 16

## 2024-03-09 DIAGNOSIS — I10 Essential (primary) hypertension: Secondary | ICD-10-CM | POA: Diagnosis not present

## 2024-03-09 DIAGNOSIS — C642 Malignant neoplasm of left kidney, except renal pelvis: Secondary | ICD-10-CM

## 2024-03-09 MED ORDER — AMLODIPINE BESYLATE 5 MG PO TABS: 5.0000 mg | ORAL_TABLET | Freq: Every day | ORAL | 0 refills | Status: DC

## 2024-03-09 NOTE — ED Triage Notes (Signed)
 Patient reports that he has had elevated BP x 4 weeks and states this is new for him. BP yesterday was 148/89 when he went for a colonoscopy. Patient states that he has an appointment with his cardiologist on 04/02/24 and is concerned that this is too far out.  Patient states he is currently not on any BP meds. Patient denies headache and states he is slightly dizzy at times and states it is not daily. Patient also reports that he has a slight  intermittent pain in his mid back.

## 2024-03-09 NOTE — ED Provider Notes (Signed)
 [facility]    CSN: [id number] Arrival date & time: 03/09/24  1314      History   Chief Complaint Chief Complaint  Patient presents with   Hypertension    Entered by patient    HPI Steve Jimenez is a 43 y.o. male.   Patient presents with concerns for blood pressure.  Patient states that he has noticed he has had elevated blood pressure for about 4 weeks, and this is new for him.  Patient states yesterday he went for a colonoscopy and his blood pressure was elevated at 148/89.  Patient states that he does have an appointment with a cardiologist on 6/30 for chronic bradycardia, but states that he wanted to be evaluated sooner just in case.  Patient states that he has never taken any blood pressure medication.  Patient denies headaches, weakness, numbness, confusion, slurred speech, chest pain, and shortness of breath.  Patient states that he occasionally has some mild dizziness, but nothing consistent.   The history is provided by the patient and medical records.  Hypertension    Past Medical History:  Diagnosis Date   Anginal pain (HCC) 10/2019   due to anxitey   Anxiety     Patient Active Problem List   Diagnosis Date Noted   Malignant neoplasm of left kidney (HCC) 05/14/2021   Renal mass 04/03/2021   Acute stress disorder 11/28/2020   Dyslipidemia 11/28/2020   Sciatica 11/28/2020   Hyperlipidemia 07/27/2019    Past Surgical History:  Procedure Laterality Date   APPENDECTOMY     age 50   ROBOT ASSISTED LAPAROSCOPIC NEPHRECTOMY Left 04/03/2021   Procedure: XI ROBOTIC ASSISTED LAPAROSCOPIC NEPHRECTOMY;  Surgeon: Adelbert Homans, MD;  Location: [facility];  Service: Urology;  Laterality: Left;       Home Medications    Prior to Admission medications   Medication Sig Start Date End Date Taking? Authorizing Provider  amLODipine (NORVASC) 5 MG tablet Take 1 tablet (5 mg total) by mouth daily. 03/09/24  Yes Katasha Riga A, NP  ALPRAZolam   (XANAX ) 1 MG tablet TAKE 1 TABLET BY MOUTH AT BEDTIME AS NEEDED FOR ANXIETY. 06/10/23   Mozingo, Regina Nattalie, NP  doxycycline  (VIBRAMYCIN ) 100 MG capsule Take twice a day for 5 days if flaring 04/28/23   Dellar Fenton, DO  pantoprazole  (PROTONIX ) 20 MG tablet Take 1 tablet (20 mg total) by mouth daily. 02/15/24   Karon Packer, NP    Family History Family History  Problem Relation Age of Onset   Hypertension Mother    Healthy Father     Social History Social History   Tobacco Use   Smoking status: Former    Current packs/day: 0.00    Types: Cigarettes    Start date: 1994    Quit date: 2004    Years since quitting: 21.4   Smokeless tobacco: Never  Vaping Use   Vaping status: Never Used  Substance Use Topics   Alcohol use: Yes    Alcohol/week: 4.0 standard drinks of alcohol    Types: 4 Standard drinks or equivalent per week   Drug use: Never     Allergies   Bee venom and Aspirin    Review of Systems Review of Systems  Per HPI  Physical Exam Triage Vital Signs ED Triage Vitals  Encounter Vitals Group     BP 03/09/24 1351 (!) 138/95     Systolic BP Percentile --      Diastolic BP Percentile --  Pulse Rate 03/09/24 1351 78     Resp 03/09/24 1351 16     Temp 03/09/24 1351 98.1 F (36.7 C)     Temp Source 03/09/24 1351 Oral     SpO2 03/09/24 1351 98 %     Weight --      Height --      Head Circumference --      Peak Flow --      Pain Score 03/09/24 1350 2     Pain Loc --      Pain Education --      Exclude from Growth Chart --    No data found.  Updated Vital Signs BP (!) 138/95 (BP Location: Left Arm)   Pulse 78   Temp 98.1 F (36.7 C) (Oral)   Resp 16   SpO2 98%   Visual Acuity Right Eye Distance:   Left Eye Distance:   Bilateral Distance:    Right Eye Near:   Left Eye Near:    Bilateral Near:     Physical Exam Vitals and nursing note reviewed.  Constitutional:      General: He is awake. He is not in acute distress.     Appearance: Normal appearance. He is well-developed and well-groomed. He is not ill-appearing.  Cardiovascular:     Rate and Rhythm: Normal rate and regular rhythm.     Pulses: Normal pulses.     Heart sounds: Normal heart sounds.  Pulmonary:     Effort: Pulmonary effort is normal.     Breath sounds: Normal breath sounds.  Skin:    General: Skin is warm and dry.  Neurological:     General: No focal deficit present.     Mental Status: He is alert and oriented to person, place, and time. Mental status is at baseline.     GCS: GCS eye subscore is 4. GCS verbal subscore is 5. GCS motor subscore is 6.     Cranial Nerves: Cranial nerves 2-12 are intact.     Sensory: Sensation is intact.     Motor: Motor function is intact.     Coordination: Coordination is intact.     Gait: Gait is intact.  Psychiatric:        Behavior: Behavior is cooperative.      UC Treatments / Results  Labs (all labs ordered are listed, but only abnormal results are displayed) Labs Reviewed - No data to display  EKG   Radiology No results found.  Procedures Procedures (including critical care time)  Medications Ordered in UC Medications - No data to display  Initial Impression / Assessment and Plan / UC Course  I have reviewed the triage vital signs and the nursing notes.  Pertinent labs & imaging results that were available during my care of the patient were reviewed by me and considered in my medical decision making (see chart for details).     Patient is well appearing.  Vitals are stable.  Blood pressure is mildly elevated at 138/95.  No significant findings upon exam.  No neurodeficits noted.  GCS 15.  Started patient on a low-dose amlodipine.  Recommended keeping appointment with cardiologist for further evaluation and management of his blood pressure.  Discussed follow-up, return, and strict ER precautions. Final Clinical Impressions(s) / UC Diagnoses   Final diagnoses:  Essential  hypertension     Discharge Instructions      Start taking 1 tablet of amlodipine once daily for your blood pressure. If you start to  become lightheaded, dizzy, blurred vision, or your blood pressure becomes too low please stop taking this medication. Keep your appointment with your cardiologist at the end of this month for further evaluation and management of this. Return here as needed. If you develop chest pain, shortness of breath, headache, weakness, numbness, slurred speech, or passing out please seek immediate medical treatment in the emergency department.  ED Prescriptions     Medication Sig Dispense Auth. Provider   amLODipine (NORVASC) 5 MG tablet Take 1 tablet (5 mg total) by mouth daily. 30 tablet Levora Reas A, NP      PDMP not reviewed this encounter.   Levora Reas A, NP 03/09/24 1505

## 2024-03-09 NOTE — Discharge Instructions (Addendum)
 Start taking 1 tablet of amlodipine once daily for your blood pressure. If you start to become lightheaded, dizzy, blurred vision, or your blood pressure becomes too low please stop taking this medication. Keep your appointment with your cardiologist at the end of this month for further evaluation and management of this. Return here as needed. If you develop chest pain, shortness of breath, headache, weakness, numbness, slurred speech, or passing out please seek immediate medical treatment in the emergency department.

## 2024-03-19 ENCOUNTER — Encounter: Payer: Self-pay | Admitting: Urology

## 2024-03-19 DIAGNOSIS — C642 Malignant neoplasm of left kidney, except renal pelvis: Secondary | ICD-10-CM | POA: Diagnosis not present

## 2024-03-21 ENCOUNTER — Ambulatory Visit
Admission: RE | Admit: 2024-03-21 | Discharge: 2024-03-21 | Disposition: A | Discharge status: Home / Self Care | Source: Ambulatory Visit | Attending: Urology | Admitting: Urology

## 2024-03-21 DIAGNOSIS — C642 Malignant neoplasm of left kidney, except renal pelvis: Secondary | ICD-10-CM

## 2024-03-21 DIAGNOSIS — R918 Other nonspecific abnormal finding of lung field: Secondary | ICD-10-CM | POA: Diagnosis not present

## 2024-03-21 DIAGNOSIS — Z905 Acquired absence of kidney: Secondary | ICD-10-CM | POA: Diagnosis not present

## 2024-03-21 DIAGNOSIS — Z85528 Personal history of other malignant neoplasm of kidney: Secondary | ICD-10-CM | POA: Diagnosis not present

## 2024-03-21 MED ORDER — IOPAMIDOL (ISOVUE-370) INJECTION 76%
80.0000 mL | Freq: Once | INTRAVENOUS | Status: AC | PRN
  Administered 2024-03-21: 80 mL via INTRAVENOUS

## 2024-03-28 ENCOUNTER — Other Ambulatory Visit: Payer: Self-pay | Admitting: Adult Health

## 2024-03-28 DIAGNOSIS — G4700 Insomnia, unspecified: Secondary | ICD-10-CM

## 2024-03-29 ENCOUNTER — Other Ambulatory Visit: Payer: Self-pay

## 2024-03-29 DIAGNOSIS — L719 Rosacea, unspecified: Secondary | ICD-10-CM

## 2024-03-29 MED ORDER — DOXYCYCLINE HYCLATE 100 MG PO CAPS: ORAL_CAPSULE | ORAL | 0 refills | Status: DC

## 2024-04-02 ENCOUNTER — Encounter: Payer: Self-pay | Admitting: Cardiovascular Disease

## 2024-04-02 ENCOUNTER — Ambulatory Visit: Attending: Cardiovascular Disease | Admitting: Cardiovascular Disease

## 2024-04-02 VITALS — BP 132/85 | HR 63 | Ht 67.0 in | Wt 230.0 lb

## 2024-04-02 DIAGNOSIS — I2510 Atherosclerotic heart disease of native coronary artery without angina pectoris: Secondary | ICD-10-CM | POA: Insufficient documentation

## 2024-04-02 DIAGNOSIS — Z09 Encounter for follow-up examination after completed treatment for conditions other than malignant neoplasm: Secondary | ICD-10-CM

## 2024-04-02 DIAGNOSIS — I10 Essential (primary) hypertension: Secondary | ICD-10-CM | POA: Insufficient documentation

## 2024-04-02 MED ORDER — AMLODIPINE BESYLATE 5 MG PO TABS: 5.0000 mg | ORAL_TABLET | Freq: Every day | ORAL | 3 refills | Status: AC

## 2024-04-02 NOTE — Assessment & Plan Note (Signed)
 History of hyperlipidemia intolerant to statin therapy.  His last lipid profile was in 2020 with an LDL of 143.  I am going to recheck a lipid liver profile.

## 2024-04-02 NOTE — Assessment & Plan Note (Signed)
 History of essential hypertension with blood pressure measured today 132/85.  He was recently started on amlodipine  with better control of his blood pressure.

## 2024-04-02 NOTE — Patient Instructions (Addendum)
 Medication Instructions:   No changes *If you need a refill on your cardiac medications before your next appointment, please call your pharmacy*   Lab Work: Fasting Lipid If you have labs (blood work) drawn today and your tests are completely normal, you will receive your results only by: MyChart Message (if you have MyChart) OR A paper copy in the mail If you have any lab test that is abnormal or we need to change your treatment, we will call you to review the results.   Testing/Procedures: CT coronary calcium  score.   Test locations:  [facility]  [facility] at [facility]  This is $99 out of pocket.   Coronary CalciumScan A coronary calcium  scan is an imaging test used to look for deposits of calcium  and other fatty materials (plaques) in the inner lining of the blood vessels of the heart (coronary arteries). These deposits of calcium  and plaques can partly clog and narrow the coronary arteries without producing any symptoms or warning signs. This puts a person at risk for a heart attack. This test can detect these deposits before symptoms develop. Tell a health care provider about: Any allergies you have. All medicines you are taking, including vitamins, herbs, eye drops, creams, and over-the-counter medicines. Any problems you or family members have had with anesthetic medicines. Any blood disorders you have. Any surgeries you have had. Any medical conditions you have. Whether you are pregnant or may be pregnant. What are the risks? Generally, this is a safe procedure. However, problems may occur, including: Harm to a pregnant woman and her unborn baby. This test involves the use of radiation. Radiation exposure can be dangerous to a pregnant woman and her unborn baby. If you are pregnant, you generally should not have this procedure done. Slight increase in the risk of cancer. This is because of  the radiation involved in the test. What happens before the procedure? No preparation is needed for this procedure. What happens during the procedure? You will undress and remove any jewelry around your neck or chest. You will put on a hospital gown. Sticky electrodes will be placed on your chest. The electrodes will be connected to an electrocardiogram (ECG) machine to record a tracing of the electrical activity of your heart. A CT scanner will take pictures of your heart. During this time, you will be asked to lie still and hold your breath for 2-3 seconds while a picture of your heart is being taken. The procedure may vary among health care providers and hospitals. What happens after the procedure? You can get dressed. You can return to your normal activities. It is up to you to get the results of your test. Ask your health care provider, or the department that is doing the test, when your results will be ready. Summary A coronary calcium  scan is an imaging test used to look for deposits of calcium  and other fatty materials (plaques) in the inner lining of the blood vessels of the heart (coronary arteries). Generally, this is a safe procedure. Tell your health care provider if you are pregnant or may be pregnant. No preparation is needed for this procedure. A CT scanner will take pictures of your heart. You can return to your normal activities after the scan is done. This information is not intended to replace advice given to you by your health care provider. Make sure you discuss any questions you have with your health care provider. Document  Released: 03/18/2008 Document Revised: 08/09/2016 Document Reviewed: 08/09/2016 Elsevier Interactive Patient Education  2017 [employer].    Follow-Up: At [facility], you and your health needs are our priority.  As part of our continuing mission to provide you with exceptional heart care, we have created designated Provider Care Teams.  These  Care Teams include your primary Cardiologist (physician) and Advanced Practice Providers (APPs -  Physician Assistants and Nurse Practitioners) who all work together to provide you with the care you need, when you need it.     Your next appointment:   1 to 2 month(s)  The format for your next appointment:   In Person  Provider:   Dorn Lesches, MD   Other Instructions

## 2024-04-02 NOTE — Progress Notes (Signed)
 Coming down the home stretch     04/02/2024 Augusten Lipkin Izquierdo   April 03, 1981  [id number]  Primary Physician [facility] Cardiologist: Dorn JINNY Lesches MD [place], [place], [place]  HPI:  Steve Jimenez is a 43 y.o.   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  He was referred by Charmaine Bright, PA-C for cardiovascular valuation because of atypical chest pain.  I last saw him in the office 07/27/2019.  His only risk factor is remote tobacco abuse having smoked a half a pack per day for 10 years and quit 13 years ago.  He has mild hyperlipidemia with lipid profile performed by his PCP 12/21/2018 revealing total cholesterol 208, LDL 143 and HDL of 33 currently not on statin therapy.  There is no family history of heart disease.  Never had heart attack stroke.  He began noticing episodes of chest pain 4 years ago that occurred twice a year lasting a week at a time all day.  Not related to activity nor is it positional and resolve spontaneously.  Since I saw him 5 years ago he did have a coronary calcium  score performed 08/02/2019 which was 0.  He had a left nephrectomy performed in 2021 for renal cell carcinoma.  He was recently diagnosed with hypertension and was begun on amlodipine .  A screening chest CT did mention calcification in the left coronary system although is completely asymptomatic.   Current Meds  Medication Sig   ALPRAZolam  (XANAX ) 1 MG tablet TAKE 1 TABLET BY MOUTH AT BEDTIME AS NEEDED FOR ANXIETY.   amLODipine  (NORVASC ) 5 MG tablet Take 1 tablet (5 mg total) by mouth daily.   doxycycline  (VIBRAMYCIN ) 100 MG capsule Take twice a day for 5 days if flaring     Allergies  Allergen Reactions   Bee Venom Anaphylaxis    Other reaction(s): anaphylaxis   Aspirin      Other reaction(s): as a child    Social History   Socioeconomic History   Marital status: Married     Spouse name: Not on file   Number of children: Not on file   Years of education: Not on file   Highest education level: Not on file  Occupational History   Not on file  Tobacco Use   Smoking status: Former    Current packs/day: 0.00    Types: Cigarettes    Start date: 67    Quit date: 2004    Years since quitting: 21.5   Smokeless tobacco: Never  Vaping Use   Vaping status: Never Used  Substance and Sexual Activity   Alcohol use: Yes    Alcohol/week: 4.0 standard drinks of alcohol    Types: 4 Standard drinks or equivalent per week   Drug use: Never   Sexual activity: Not on file  Other Topics Concern   Not on file  Social History Narrative   Not on file   Social Drivers of Health   Financial Resource Strain: Not on file  Food Insecurity: Not on file  Transportation Needs: Not on file  Physical Activity: Not on file  Stress: Not on file  Social Connections: Not on file  Intimate Partner Violence: Not on file     Review of Systems: General: negative for chills, fever, night sweats or weight changes.  Cardiovascular: negative for chest pain, dyspnea on exertion, edema, orthopnea, palpitations, paroxysmal nocturnal dyspnea or shortness of breath Dermatological:  negative for rash Respiratory: negative for cough or wheezing Urologic: negative for hematuria Abdominal: negative for nausea, vomiting, diarrhea, bright red blood per rectum, melena, or hematemesis Neurologic: negative for visual changes, syncope, or dizziness All other systems reviewed and are otherwise negative except as noted above.    Blood pressure 132/85, pulse 63, height 5' 7 (1.702 m), weight 230 lb (104.3 kg), SpO2 99%.  General appearance: alert and no distress Neck: no adenopathy, no carotid bruit, no JVD, supple, symmetrical, trachea midline, and thyroid  not enlarged, symmetric, no tenderness/mass/nodules Lungs: clear to auscultation bilaterally Heart: regular rate and rhythm, S1, S2 normal, no  murmur, click, rub or gallop Extremities: extremities normal, atraumatic, no cyanosis or edema Pulses: 2+ and symmetric Skin: Skin color, texture, turgor normal. No rashes or lesions Neurologic: Grossly normal  EKG EKG Interpretation Date/Time:  Monday April 02 2024 15:21:42 EDT Ventricular Rate:  63 PR Interval:  168 QRS Duration:  98 QT Interval:  390 QTC Calculation: 399 R Axis:   -23  Text Interpretation: Normal sinus rhythm Possible Anterior infarct (cited on or before 02-Apr-2024) When compared with ECG of 04-Apr-2021 13:06, Nonspecific T wave abnormality has replaced inverted T waves in Inferior leads Confirmed by Court Carrier [phone]) on 04/02/2024 4:13:46 PM    ASSESSMENT AND PLAN:   Hyperlipidemia History of hyperlipidemia intolerant to statin therapy.  His last lipid profile was in 2020 with an LDL of 143.  I am going to recheck a lipid liver profile.  Essential hypertension History of essential hypertension with blood pressure measured today 132/85.  He was recently started on amlodipine  with better control of his blood pressure.  Coronary artery calcification seen on CT scan Mr. Klas recently had a chest CT that showed calcification in the left coronary system.  He did have a coronary calcium  score of 0 performed 08/02/2019.  I am going to repeat a coronary calcium  score.  He is totally asymptomatic.     Carrier DOROTHA Court MD FACP,FACC,FAHA, [facility] 04/02/2024 4:26 PM

## 2024-04-02 NOTE — Assessment & Plan Note (Signed)
 Steve Jimenez recently had a chest CT that showed calcification in the left coronary system.  He did have a coronary calcium  score of 0 performed 08/02/2019.  I am going to repeat a coronary calcium  score.  He is totally asymptomatic.

## 2024-04-03 ENCOUNTER — Other Ambulatory Visit: Payer: Self-pay

## 2024-04-20 ENCOUNTER — Ambulatory Visit (HOSPITAL_BASED_OUTPATIENT_CLINIC_OR_DEPARTMENT_OTHER)
Admission: RE | Admit: 2024-04-20 | Discharge: 2024-04-20 | Disposition: A | Discharge status: Home / Self Care | Payer: Self-pay | Source: Ambulatory Visit | Attending: Cardiovascular Disease | Admitting: Cardiovascular Disease

## 2024-04-20 DIAGNOSIS — Z09 Encounter for follow-up examination after completed treatment for conditions other than malignant neoplasm: Secondary | ICD-10-CM | POA: Insufficient documentation

## 2024-04-20 DIAGNOSIS — I10 Essential (primary) hypertension: Secondary | ICD-10-CM | POA: Insufficient documentation

## 2024-04-20 DIAGNOSIS — I2510 Atherosclerotic heart disease of native coronary artery without angina pectoris: Secondary | ICD-10-CM | POA: Insufficient documentation

## 2024-04-22 ENCOUNTER — Ambulatory Visit: Payer: Self-pay | Admitting: Cardiovascular Disease

## 2024-04-25 ENCOUNTER — Encounter: Payer: Self-pay | Admitting: Dermatology

## 2024-04-25 ENCOUNTER — Ambulatory Visit: Admitting: Dermatology

## 2024-04-25 DIAGNOSIS — L719 Rosacea, unspecified: Secondary | ICD-10-CM | POA: Diagnosis not present

## 2024-04-25 MED ORDER — IVERMECTIN 1 % EX CREA: 1.0000 | TOPICAL_CREAM | Freq: Every morning | CUTANEOUS | 9 refills | Status: AC

## 2024-04-25 MED ORDER — ADAPALENE 0.3 % EX GEL
1.0000 | Freq: Every day | CUTANEOUS | 9 refills | Status: AC
Start: 2024-04-25 — End: ?

## 2024-04-25 MED ORDER — DOXYCYCLINE HYCLATE 50 MG PO CAPS: 50.0000 mg | ORAL_CAPSULE | Freq: Every day | ORAL | 6 refills | Status: DC

## 2024-04-25 NOTE — Progress Notes (Signed)
   Follow-Up Visit   Subjective  Steve Jimenez is a 43 y.o. male who presents for the following: Rosacea  Patient present today for follow up visit for Rosacea. Patient was last evaluated on 04/28/23. At this visit patient was prescribed Metrogel , Doxycyline 100 mg BID for 5 days at the time of a flare. Patient reports he feels his sxs are unchanged. Patient denies medication changes. Patient reprots he stoppedthe Metrogel  6 weeks ago because he does not feel it has helped.   The following portions of the chart were reviewed this encounter and updated as appropriate: medications, allergies, medical history  Review of Systems:  No other skin or systemic complaints except as noted in HPI or Assessment and Plan.  Objective  Well appearing patient in no apparent distress; mood and affect are within normal limits.  A focused examination was performed of the following areas: Face  Relevant exam findings are noted in the Assessment and Plan.           Assessment & Plan   Rosacea - Assessment: Patient's rosacea is currently controlled with doxycycline  hyclate but worsens without it. The condition is severe enough to warrant daily antibiotic therapy to prevent progression to advanced stages like rhinophyma. Current treatment includes 200 mg doxycycline  daily, which has been effective in controlling symptoms.  - Plan:    Adjust doxycycline  dosage:     - Continue 200 mg daily for 1 week to control current flare-up     - Decrease to 50 mg daily for maintenance therapy     - If ineffective after 2 months, may increase to 100 mg daily    Modify topical regimen:     - Apply ivermectin  cream in the morning     - Apply adapalene  every other night, increasing to nightly use over 1 month    Provide samples of Cicalfate balm by [employer] for nighttime use    Recommend gentle face wash and sunscreen use    Patient education:     - Informed about the benefits of daily antibiotic intake for men  in preventing severe rosacea stages     - Advised on proper application of topical medications (pea-sized amount)     - Educated on rosacea triggers, including heat and sun exposure     No follow-ups on file.  I, Jetta Ager, am acting as Neurosurgeon for [name], DO.  Documentation: I have reviewed the above documentation for accuracy and completeness, and I agree with the above.  Steve Lenis, DO

## 2024-04-25 NOTE — Patient Instructions (Addendum)
 Date: Wed Apr 25 2024  Hello Steve Jimenez,  Thank you for visiting today. Here is a summary of the key instructions:  - Medications:   - Take doxycycline  hyclate:     - Continue 200 mg daily for 1 week     - Then switch to 50 mg daily   - If 50 mg is not effective after 2 months, increase to 100 mg daily  - Topical Treatments:   - Apply ivermectin  in the morning (pea-sized amount)   - Apply adapalene  at night:     - Start every other night     - Increase to nightly use over 1 month   - Use Cicalfate balm by [employer] at night  - Skin Care:   - Use a gentle face wash   - Wear sunscreen daily  - Follow-up: Return for a follow-up appointment in 4 months  Please reach out if you have any questions or concerns.  Warm regards,  Dr. [facility]      Important Information   Due to recent changes in healthcare laws, you may see results of your pathology and/or laboratory studies on MyChart before the doctors have had a chance to review them. We understand that in some cases there may be results that are confusing or concerning to you. Please understand that not all results are received at the same time and often the doctors may need to interpret multiple results in order to provide you with the best plan of care or course of treatment. Therefore, we ask that you please give us  2 business days to thoroughly review all your results before contacting the office for clarification. Should we see a critical lab result, you will be contacted sooner.     If You Need Anything After Your Visit   If you have any questions or concerns for your doctor, please call our main line at [phone]. If no one answers, please leave a voicemail as directed and we will return your call as soon as possible. Messages left after 4 pm will be answered the following business day.    You may also send us  a message via MyChart. We typically respond to MyChart messages within 1-2 business  days.  For prescription refills, please ask your pharmacy to contact our office. Our fax number is [phone].  If you have an urgent issue when the clinic is closed that cannot wait until the next business day, you can page your doctor at the number below.     Please note that while we do our best to be available for urgent issues outside of office hours, we are not available 24/7.    If you have an urgent issue and are unable to reach us , you may choose to seek medical care at your doctor's office, retail clinic, urgent care center, or emergency room.   If you have a medical emergency, please immediately call 911 or go to the emergency department. In the event of inclement weather, please call our main line at [phone] for an update on the status of any delays or closures.  Dermatology Medication Tips: Please keep the boxes that topical medications come in in order to help keep track of the instructions about where and how to use these. Pharmacies typically print the medication instructions only on the boxes and not directly on the medication tubes.   If your medication is too expensive, please contact our office at [phone] or send us  a message through MyChart.  We are unable to tell what your co-pay for medications will be in advance as this is different depending on your insurance coverage. However, we may be able to find a substitute medication at lower cost or fill out paperwork to get insurance to cover a needed medication.    If a prior authorization is required to get your medication covered by your insurance company, please allow us  1-2 business days to complete this process.   Drug prices often vary depending on where the prescription is filled and some pharmacies may offer cheaper prices.   The website www.goodrx.com contains coupons for medications through different pharmacies. The prices here do not account for what the cost may be with help from insurance (it may  be cheaper with your insurance), but the website can give you the price if you did not use any insurance.  - You can print the associated coupon and take it with your prescription to the pharmacy.  - You may also stop by our office during regular business hours and pick up a GoodRx coupon card.  - If you need your prescription sent electronically to a different pharmacy, notify our office through [facility] or by phone at [phone]

## 2024-04-26 ENCOUNTER — Telehealth: Payer: Self-pay | Admitting: Cardiovascular Disease

## 2024-04-26 ENCOUNTER — Other Ambulatory Visit: Payer: Self-pay

## 2024-04-26 DIAGNOSIS — C642 Malignant neoplasm of left kidney, except renal pelvis: Secondary | ICD-10-CM | POA: Diagnosis not present

## 2024-04-26 LAB — HEPATIC FUNCTION PANEL

## 2024-04-26 LAB — LIPID PANEL

## 2024-04-26 NOTE — Telephone Encounter (Signed)
 [facility] calling to speak with JB nurse regarding labs

## 2024-04-26 NOTE — Telephone Encounter (Signed)
 [facility] called and stated that there was error at [employer].  Patient went to [facility] on 04/20/24 - lipid,hepatic. The labs were not completed /done-- by [facility].   Venecia states [employer] has tried to contact patient several times with no success.  She is informing [facility] the information and request that [facility] sent patient a message by MyChart.   Altamese states the previous labs were not charged but when the patient comes to redo this will be charged to his insurance  RN sent a message to patient by way of MyChart

## 2024-04-27 NOTE — Telephone Encounter (Signed)
 [facility] leadership notified of patient's complaint and concerns. Will await response before contacting patient.

## 2024-04-30 ENCOUNTER — Other Ambulatory Visit: Payer: Self-pay | Admitting: *Deleted

## 2024-04-30 DIAGNOSIS — I10 Essential (primary) hypertension: Secondary | ICD-10-CM | POA: Diagnosis not present

## 2024-04-30 DIAGNOSIS — I2510 Atherosclerotic heart disease of native coronary artery without angina pectoris: Secondary | ICD-10-CM | POA: Diagnosis not present

## 2024-04-30 NOTE — Progress Notes (Signed)
Lab re-entered.

## 2024-05-01 ENCOUNTER — Ambulatory Visit: Payer: Self-pay | Admitting: Cardiovascular Disease

## 2024-05-01 DIAGNOSIS — E782 Mixed hyperlipidemia: Secondary | ICD-10-CM

## 2024-05-01 LAB — LIPID PANEL
Chol/HDL Ratio: 7 ratio — ABNORMAL HIGH (ref 0.0–5.0)
Cholesterol, Total: 265 mg/dL — ABNORMAL HIGH (ref 100–199)
HDL: 38 mg/dL — ABNORMAL LOW (ref >39)
LDL Chol Calc (NIH): 208 mg/dL — ABNORMAL HIGH (ref 0–99)
Triglycerides: 105 mg/dL (ref 0–149)
VLDL Cholesterol Cal: 19 mg/dL (ref 5–40)

## 2024-05-01 LAB — HEPATIC FUNCTION PANEL
ALT: 31 IU/L (ref 0–44)
AST: 24 IU/L (ref 0–40)
Albumin: 4.3 g/dL (ref 4.1–5.1)
Alkaline Phosphatase: 89 IU/L (ref 44–121)
Bilirubin Total: 0.4 mg/dL (ref 0.0–1.2)
Bilirubin, Direct: 0.13 mg/dL (ref 0.00–0.40)
Total Protein: 7.7 g/dL (ref 6.0–8.5)

## 2024-05-01 NOTE — Telephone Encounter (Signed)
 Spoke with patient and apologized for his experience. Explained that lab orders showed canceled in [facility] system due to error and as a result, had to be reordered. Pt verbalized understanding and denied additional concerns related to this issue. Lab results also reviewed with pt (see separate encounter).

## 2024-05-08 ENCOUNTER — Telehealth: Payer: Self-pay

## 2024-05-08 ENCOUNTER — Encounter: Payer: Self-pay | Admitting: Oncology

## 2024-05-08 ENCOUNTER — Encounter: Payer: Self-pay | Admitting: Cardiovascular Disease

## 2024-05-08 ENCOUNTER — Ambulatory Visit: Attending: Cardiovascular Disease | Admitting: Cardiovascular Disease

## 2024-05-08 ENCOUNTER — Other Ambulatory Visit (HOSPITAL_COMMUNITY): Payer: Self-pay

## 2024-05-08 ENCOUNTER — Telehealth: Payer: Self-pay | Admitting: Pharmacist

## 2024-05-08 VITALS — BP 130/90 | HR 52 | Ht 67.0 in | Wt 239.6 lb

## 2024-05-08 DIAGNOSIS — R931 Abnormal findings on diagnostic imaging of heart and coronary circulation: Secondary | ICD-10-CM | POA: Diagnosis not present

## 2024-05-08 NOTE — Telephone Encounter (Signed)
 PA request has been Submitted. New Encounter has been or will be created for follow up. For additional info see Pharmacy Prior Auth telephone encounter from 05/08/24.

## 2024-05-08 NOTE — Patient Instructions (Signed)
 Medication Instructions:  Your physician recommends that you continue on your current medications as directed. Please refer to the Current Medication list given to you today.  *If you need a refill on your cardiac medications before your next appointment, please call your pharmacy*   Follow-Up: At [facility], you and your health needs are our priority.  As part of our continuing mission to provide you with exceptional heart care, our providers are all part of one team.  This team includes your primary Cardiologist (physician) and Advanced Practice Providers or APPs (Physician Assistants and Nurse Practitioners) who all work together to provide you with the care you need, when you need it.  Your next appointment:   6 month(s)  Provider:   Jon Hails, PA-C, Callie Goodrich, PA-C, Hao Meng, PA-C, or Damien Braver, NP         Then, Dorn Lesches, MD will plan to see you again in 12 month(s).   We recommend signing up for the patient portal called MyChart.  Sign up information is provided on this After Visit Summary.  MyChart is used to connect with patients for Virtual Visits (Telemedicine).  Patients are able to view lab/test results, encounter notes, upcoming appointments, etc.  Non-urgent messages can be sent to your provider as well.   To learn more about what you can do with MyChart, go to [email or web address].

## 2024-05-08 NOTE — Telephone Encounter (Signed)
 Pharmacy Patient Advocate Encounter   Received notification from Physician's Office that prior authorization for REPATHA is required/requested.   Insurance verification completed.   The patient is insured through [facility] .   Per test claim: PA required; PA submitted to above mentioned insurance via CoverMyMeds Key/confirmation #/EOC [id number] Status is pending

## 2024-05-08 NOTE — Progress Notes (Signed)
 Mr. Steve Jimenez returns today for follow-up of his coronary calcium  score and lipid profile.  I last saw him in the office 04/02/2024.  I repeated his coronary calcium  score on 04/21/2024 which was 58.9 mostly in the LAD territory.  This represents a significant increase compared to his coronary calcium  score of 0 performed 08/02/2019.  He is completely asymptomatic.  I did repeat a lipid profile 04/30/2024 revealing total cholesterol 265, LDL of 208 and HDL of 38.  He is statin intolerant.  Plan will be to start him on Repatha with an LDL goal of less than 70.  Dorn DOROTHA Lesches, M.D., FACP, [facility], FAHA, [facility]  [address] [place], [place]  [address]  [phone] 05/08/2024 8:19 AM

## 2024-05-08 NOTE — Telephone Encounter (Signed)
 In room consult after appt with Dr Court. LDL >200, intolerant to statins. Has coronary calcium  as well. Recommend starting PCSK9i. Using demo pen, educated on storage, site selection, administration, and possible adverse effects. Advised where to find savings card. Will complete PA and contact patient with result. Recheck FLP in 2-3 months

## 2024-05-09 ENCOUNTER — Other Ambulatory Visit (HOSPITAL_COMMUNITY): Payer: Self-pay

## 2024-05-09 NOTE — Telephone Encounter (Signed)
Scanned in media as well.

## 2024-05-10 ENCOUNTER — Other Ambulatory Visit (HOSPITAL_COMMUNITY): Payer: Self-pay

## 2024-05-10 NOTE — Telephone Encounter (Signed)
 Pharmacy Patient Advocate Encounter  Received notification from [facility] that Prior Authorization for REPATHA has been DENIED.  Full denial letter will be uploaded to the media tab. See denial reason below.

## 2024-06-18 ENCOUNTER — Ambulatory Visit: Admitting: Pharmacist Clinician (PhC)/ Clinical Pharmacy Specialist

## 2024-06-20 ENCOUNTER — Encounter: Payer: Self-pay | Admitting: Cardiovascular Disease

## 2024-06-20 DIAGNOSIS — E782 Mixed hyperlipidemia: Secondary | ICD-10-CM

## 2024-06-20 DIAGNOSIS — R931 Abnormal findings on diagnostic imaging of heart and coronary circulation: Secondary | ICD-10-CM

## 2024-08-27 NOTE — Progress Notes (Unsigned)
 Patient ID: Steve Jimenez                 DOB: September 04, 1981                    MRN: [id number]      HPI: Steve Jimenez is a 43 y.o. male patient referred to lipid clinic by Dr. Court. PMH is significant for HTN, HLD, CAC score of 58.9,   Last LDL 208 (7/28)  Reviewed options for lowering LDL cholesterol, including ezetimibe, PCSK-9 inhibitors, bempedoic acid and inclisiran.  Discussed mechanisms of action, dosing, side effects and potential decreases in LDL cholesterol.  Also reviewed cost information and potential options for patient assistance.   Current Medications:  Intolerances: atorvastatin  20 mg (myalgias) Risk Factors: HTN, CAC score  LDL-C goal: <70    Diet:   Exercise:   Family History:  Mother: Hypertension   Social History:   Labs: Lipid Panel     Component Value Date/Time   CHOL 265 (H) 04/30/2024 0821   TRIG 105 04/30/2024 0821   HDL 38 (L) 04/30/2024 0821   CHOLHDL 7.0 (H) 04/30/2024 0821   LDLCALC 208 (H) 04/30/2024 0821   LABVLDL 19 04/30/2024 0821    Past Medical History:  Diagnosis Date   Anginal pain 10/2019   due to anxitey   Anxiety     Current Outpatient Medications on File Prior to Visit  Medication Sig Dispense Refill   Adapalene  (DIFFERIN ) 0.3 % gel Apply 1 Application topically at bedtime. Apply 3 nights weekly (M-W-F) 59 g 9   ALPRAZolam  (XANAX ) 1 MG tablet TAKE 1 TABLET BY MOUTH AT BEDTIME AS NEEDED FOR ANXIETY. 30 tablet 2   amLODipine  (NORVASC ) 5 MG tablet Take 1 tablet (5 mg total) by mouth daily. 90 tablet 3   doxycycline  (VIBRAMYCIN ) 50 MG capsule Take 1 capsule (50 mg total) by mouth daily. 60 capsule 6   Ivermectin  1 % CREA Apply 1 Application topically in the morning. Apply in the morning after washing face 45 g 9   No current facility-administered medications on file prior to visit.    Allergies  Allergen Reactions   Bee Venom Anaphylaxis    Other reaction(s): anaphylaxis   Aspirin      Other reaction(s): as a  child    Assessment/Plan:  1. Hyperlipidemia -  No problem-specific Assessment & Plan notes found for this encounter.    Thank you,  Melissa D Maccia, Pharm.JONETTA SARAN, CPP [facility], [facility], [facility] [address], [place], [place] [address]  Phone: [phone]; Fax: [phone]

## 2024-08-27 NOTE — Progress Notes (Unsigned)
 Patient ID: Steve Jimenez                 DOB: 05/07/1981                    MRN: [id number]      HPI: Steve Jimenez is a 43 y.o. male patient referred to lipid clinic by  Dr. Court. PMH is significant for HLD, HTN, CAD seen on CT (CAC score 58.9; 91st percentile), and malignant neoplasm of left kidney.  Patient was last seen in office by Dr. Court on 05/08/24 for follow-up of his coronary calcium  score and lipid panel. Calcium  score on 04/20/24 was 59.9 (91st percentile)LDL on 04/30/24 was elevated at 208 on no lipid-lowering medication as he is intolerant to atorvastatin . [employer] denied Repatha  on 05/10/24. Patient was referred to PharmD to initiate alternative lipid-lowering therapy.  Patient presents to clinic today  Reviewed options for lowering LDL cholesterol, including ezetimibe, PCSK-9 inhibitors, bempedoic acid and inclisiran.  Discussed mechanisms of action, dosing, side effects and potential decreases in LDL cholesterol.  Also reviewed cost information and potential options for patient assistance.  Current Medications: none Intolerances: myalgias on atorvastatin  20 mg (has tried taking it every other day) Risk Factors: HTN LDL-C goal: <70 ApoB goal:   Diet:   Exercise:   Family History:   Social History:   Labs: Lipid Panel     Component Value Date/Time   CHOL 265 (H) 04/30/2024 0821   TRIG 105 04/30/2024 0821   HDL 38 (L) 04/30/2024 0821   CHOLHDL 7.0 (H) 04/30/2024 0821   LDLCALC 208 (H) 04/30/2024 0821   LABVLDL 19 04/30/2024 0821    Past Medical History:  Diagnosis Date   Anginal pain 10/2019   due to anxitey   Anxiety     Current Outpatient Medications on File Prior to Visit  Medication Sig Dispense Refill   Adapalene  (DIFFERIN ) 0.3 % gel Apply 1 Application topically at bedtime. Apply 3 nights weekly (M-W-F) 59 g 9   ALPRAZolam  (XANAX ) 1 MG tablet TAKE 1 TABLET BY MOUTH AT BEDTIME AS NEEDED FOR ANXIETY. 30 tablet 2   amLODipine  (NORVASC ) 5 MG  tablet Take 1 tablet (5 mg total) by mouth daily. 90 tablet 3   doxycycline  (VIBRAMYCIN ) 50 MG capsule Take 1 capsule (50 mg total) by mouth daily. 60 capsule 6   Ivermectin  1 % CREA Apply 1 Application topically in the morning. Apply in the morning after washing face 45 g 9   No current facility-administered medications on file prior to visit.    Allergies  Allergen Reactions   Bee Venom Anaphylaxis    Other reaction(s): anaphylaxis   Aspirin      Other reaction(s): as a child    Assessment/Plan:  1. Hyperlipidemia -  No problem-specific Assessment & Plan notes found for this encounter.    Thank you,  B. Maegan Tiandra Swoveland, PharmD PGY-1 Pharmacy Resident [facility]  Melissa D Maccia, [place].JONETTA SARAN, CPP [facility] of [facility] [address] [place], [place] [address]  Phone: [phone]; Fax: [phone]

## 2024-08-28 ENCOUNTER — Other Ambulatory Visit (HOSPITAL_COMMUNITY): Payer: Self-pay

## 2024-08-28 ENCOUNTER — Telehealth: Payer: Self-pay | Admitting: Pharmacy Technician

## 2024-08-28 ENCOUNTER — Encounter: Payer: Self-pay | Admitting: Pharmacist

## 2024-08-28 ENCOUNTER — Ambulatory Visit: Admitting: Pharmacist

## 2024-08-28 DIAGNOSIS — E782 Mixed hyperlipidemia: Secondary | ICD-10-CM | POA: Diagnosis not present

## 2024-08-28 MED ORDER — REPATHA SURECLICK 140 MG/ML ~~LOC~~ SOAJ: 1.0000 mL | SUBCUTANEOUS | 11 refills | Status: AC

## 2024-08-28 MED ORDER — ROSUVASTATIN CALCIUM 5 MG PO TABS: 5.0000 mg | ORAL_TABLET | Freq: Every day | ORAL | 3 refills | Status: AC

## 2024-08-28 NOTE — Telephone Encounter (Signed)
 Pharmacy Patient Advocate Encounter   Received notification from Physician's Office that prior authorization for Nexlizet is required/requested.   Insurance verification completed.   The patient is insured through [facility].   Per test claim: The current 08/28/24 day co-pay is, $10.00- one month.  No PA needed at this time. This test claim was processed through [facility]- copay amounts may vary at other pharmacies due to pharmacy/plan contracts, or as the patient moves through the different stages of their insurance plan.

## 2024-08-28 NOTE — Patient Instructions (Addendum)
 Your Results:             Your most recent labs Goal  Total Cholesterol 265 < 200  Triglycerides 105 < 150  HDL (happy/good cholesterol) 38 > 40  LDL (lousy/bad cholesterol 208 < 70   Medication changes: Start rosuvastatin  5 mg by mouth every other day Repatha  140 mg injected under the skin every 2 weeks ([email or web address])  Repatha  is a cholesterol medication that improved your body's ability to get rid of bad cholesterol known as LDL. It can lower your LDL up to 60%! It is an injection that is given under the skin every 2 weeks. The medication often requires a prior authorization from your insurance company. We will take care of submitting all the necessary information to your insurance company to get it approved. The most common side effects of Repatha  include runny nose, symptoms of the common cold, rarely flu or flu-like symptoms, back/muscle pain in about 3-4% of the patients, and redness, pain, or bruising at the injection site.  We will also look into Nexlizet and Leqvio options for you as well!  Lab orders: We want to repeat labs after 2-3 months.  We will send you a lab order to remind you once we get closer to that time.

## 2024-08-28 NOTE — Assessment & Plan Note (Addendum)
 Assessment: Last LDL on 04/30/24 was 208, which is above goal <70 on no lipid-lowering therapy Patient experienced myalgias on atorvastatin  20 mg daily (has only tried and been intolerant to atorvastatin ) Insurance has denied Repatha  previously Patient is willing to try rosuvastatin  5 mg 3 times weekly and paying the cash-price for Repatha  for now Plan: Start rosuvastatin  5 mg three times weekly Start Repatha  140 mg injected under the skin every 14 days Follow-up lipid panel in 3 months (end of February) Will also assess the cost of Leqvio and Nexlizet

## 2024-09-13 ENCOUNTER — Ambulatory Visit: Admitting: Dermatology

## 2024-09-13 ENCOUNTER — Encounter: Payer: Self-pay | Admitting: Dermatology

## 2024-09-13 VITALS — BP 139/89 | HR 53

## 2024-09-13 DIAGNOSIS — L719 Rosacea, unspecified: Secondary | ICD-10-CM | POA: Diagnosis not present

## 2024-09-13 MED ORDER — DOXYCYCLINE HYCLATE 100 MG PO TABS: 100.0000 mg | ORAL_TABLET | Freq: Two times a day (BID) | ORAL | 9 refills | Status: AC

## 2024-09-13 NOTE — Patient Instructions (Addendum)
 VISIT SUMMARY:  You came in today for management of your acne. We discussed your current treatment regimen and made some adjustments to help improve your symptoms.  YOUR PLAN:  -ROSACEA:  Rosacea is a chronic skin condition that causes redness and visible blood vessels in your face.   We have increased your doxycycline  dose to 100 mg daily to help reduce the bumps and papules.   Continue using ivermectin  every morning and adapalene  0.3% every night, but reduce adapalene  to every other night if you experience dryness and peeling.   We also recommend using La Roche-Posay Cicaplast balm at night for its calming properties.   Take doxycycline  100mg  with food to prevent an upset stomach. Avoid dietary triggers such as dairy and sugar to prevent flares. During severe flares, take two doxycycline  tablets daily until symptoms resolve.  INSTRUCTIONS:  Follow up with us  if you experience any severe side effects or if your symptoms do not improve. Continue with the new treatment plan and let us  know how it works for you.     Important Information   Due to recent changes in healthcare laws, you may see results of your pathology and/or laboratory studies on MyChart before the doctors have had a chance to review them. We understand that in some cases there may be results that are confusing or concerning to you. Please understand that not all results are received at the same time and often the doctors may need to interpret multiple results in order to provide you with the best plan of care or course of treatment. Therefore, we ask that you please give us  2 business days to thoroughly review all your results before contacting the office for clarification. Should we see a critical lab result, you will be contacted sooner.     If You Need Anything After Your Visit   If you have any questions or concerns for your doctor, please call our main line at [phone]. If no one answers, please leave a  voicemail as directed and we will return your call as soon as possible. Messages left after 4 pm will be answered the following business day.    You may also send us  a message via MyChart. We typically respond to MyChart messages within 1-2 business days.  For prescription refills, please ask your pharmacy to contact our office. Our fax number is [phone].  If you have an urgent issue when the clinic is closed that cannot wait until the next business day, you can page your doctor at the number below.     Please note that while we do our best to be available for urgent issues outside of office hours, we are not available 24/7.    If you have an urgent issue and are unable to reach us , you may choose to seek medical care at your doctor's office, retail clinic, urgent care center, or emergency room.   If you have a medical emergency, please immediately call 911 or go to the emergency department. In the event of inclement weather, please call our main line at [phone] for an update on the status of any delays or closures.  Dermatology Medication Tips: Please keep the boxes that topical medications come in in order to help keep track of the instructions about where and how to use these. Pharmacies typically print the medication instructions only on the boxes and not directly on the medication tubes.   If your medication is too expensive, please contact our office at [phone] or  send us  a message through MyChart.    We are unable to tell what your co-pay for medications will be in advance as this is different depending on your insurance coverage. However, we may be able to find a substitute medication at lower cost or fill out paperwork to get insurance to cover a needed medication.    If a prior authorization is required to get your medication covered by your insurance company, please allow us  1-2 business days to complete this process.   Drug prices often vary depending on where the  prescription is filled and some pharmacies may offer cheaper prices.   The website www.goodrx.com contains coupons for medications through different pharmacies. The prices here do not account for what the cost may be with help from insurance (it may be cheaper with your insurance), but the website can give you the price if you did not use any insurance.  - You can print the associated coupon and take it with your prescription to the pharmacy.  - You may also stop by our office during regular business hours and pick up a GoodRx coupon card.  - If you need your prescription sent electronically to a different pharmacy, notify our office through [facility] or by phone at [phone]

## 2024-09-13 NOTE — Progress Notes (Signed)
° °  Follow-Up Visit   Subjective  Steve Jimenez is a 43 y.o. male who presents for the following: Rosacea  Patient present today for follow up visit for Rosacea. Patient was last evaluated on 04/25/2024. At this visit patient was prescribed Doxycycline  200 mg for 1 week, then decrease to 50 mg daily & advised to increase to 100 mg daily if 50 mg isn't controlling sxs. He was also prescribed Ivermectin  Cream to apply in the morning & recommended to apply otc adapalene  0.3% nightly. He was recommended to wear daily sunscreen and also washing with gentle cleansers.   Patient reports sxs are improved. His current regimen consist of he is consist ivermectin  in the am and Adapelene 0.3% at night. Patient denies medication changes.  Patient provided verbal consent for the use of an AI-assisted program to generate a detailed after-visit summary. The patient understands that the AI tool is used to support clinical documentation and that all information will be reviewed and verified by the healthcare provider.  The following portions of the chart were reviewed this encounter and updated as appropriate: medications, allergies, medical history  Review of Systems:  No other skin or systemic complaints except as noted in HPI or Assessment and Plan.  Objective  Well appearing patient in no apparent distress; mood and affect are within normal limits.  A focused examination was performed of the following areas: Face  Relevant exam findings are noted in the Assessment and Plan.           Assessment & Plan   ROSACEA Exam: Mid face erythema with telangiectasias + scattered inflammatory papules  Not at goal  Rosacea is a chronic progressive skin condition usually affecting the face of adults, causing redness and/or acne bumps. It is treatable but not curable. It sometimes affects the eyes (ocular rosacea) as well. It may respond to topical and/or systemic medication and can flare with stress,  sun exposure, alcohol, exercise, topical steroids (including hydrocortisone/cortisone 10) and some foods.  Daily application of broad spectrum spf 30+ sunscreen to face is recommended to reduce flares.  Patient denies grittiness of the eyes  Treatment Plan - Recommended increasing to 100 mg of Doxycycline  100 mg  - Continue Ivermectin  in the AM and Adapalene   ROSACEA   This Visit - doxycycline  (VIBRA -TABS) 100 MG tablet - Take 1 tablet (100 mg total) by mouth 2 (two) times daily. TAKE WITH HEAVY MEAL TO PREVENT GI UPSET Existing Treatments - Ivermectin  1 % CREA - Apply 1 Application topically in the morning. Apply in the morning after washing face - Adapalene  (DIFFERIN ) 0.3 % gel - Apply 1 Application topically at bedtime. Apply 3 nights weekly (M-W-F)  Return in about 8 months (around 05/14/2025) for Rosacea F/U.  I, Jacquilyn Seldon, am acting as neurosurgeon for [name], DO.  Documentation: I have reviewed the above documentation for accuracy and completeness, and I agree with the above.  Delon Lenis, DO

## 2024-09-14 ENCOUNTER — Other Ambulatory Visit: Payer: Self-pay | Admitting: Pharmacist

## 2024-09-14 ENCOUNTER — Other Ambulatory Visit (HOSPITAL_COMMUNITY): Payer: Self-pay | Admitting: Cardiovascular Disease

## 2024-09-14 NOTE — Addendum Note (Signed)
 Addended by: Allysson Rinehimer D on: 09/14/2024 10:28 AM   Modules accepted: Orders

## 2024-09-18 ENCOUNTER — Telehealth: Payer: Self-pay | Admitting: Pharmacy Technician

## 2024-09-18 NOTE — Telephone Encounter (Signed)
 Dr. Court, The leqvio has been denied due to patient has not tried or failed preferred medications. Praluent Repatha  The denial letter has been scanned to the media tab for your review.  Auth Submission: DENIED Site of care: Site of care: [facility] Payer: [employer] Medication & CPT/J Code(s) submitted: Leqvio (Inclisiran) [id number] Diagnosis Code: E78.5 Route of submission (phone, fax, portal): LATENT Phone # Fax # Auth type: Buy/Bill PB Units/visits requested:  Reference number: [id number]

## 2024-09-18 NOTE — Addendum Note (Signed)
 Addended by: DAYNE SHERRY RAMAN on: 09/18/2024 10:57 AM   Modules accepted: Orders

## 2024-09-19 NOTE — Telephone Encounter (Signed)
 Reviewed with patient.  He has taken 2 shots of Repatha  so far.  He is paying the cash option since his insurance will not cover.  Tolerating fine.  Not doing so well on the rosuvastatin  5 mg 3 times a week.  Advise he can decrease to once a week or stop.  He will get labs done in January to assess how well Repatha  is working.  Interestingly his insurance is covering Nexlizet without a prior authorization.  If labs above goal, which they most likely will be, we will go ahead and add Nexlizet.
